# Patient Record
Sex: Male | Born: 1945 | Race: White | Hispanic: No | Marital: Married | Smoking: Former smoker
Health system: Southern US, Community
[De-identification: ages and names within clinical notes are randomized; demographics above are authoritative.]

## PROBLEM LIST (undated history)

## (undated) DIAGNOSIS — K219 Gastro-esophageal reflux disease without esophagitis: Secondary | ICD-10-CM

## (undated) DIAGNOSIS — R351 Nocturia: Secondary | ICD-10-CM

## (undated) DIAGNOSIS — T7840XA Allergy, unspecified, initial encounter: Secondary | ICD-10-CM

## (undated) DIAGNOSIS — N401 Enlarged prostate with lower urinary tract symptoms: Secondary | ICD-10-CM

## (undated) DIAGNOSIS — R319 Hematuria, unspecified: Secondary | ICD-10-CM

## (undated) HISTORY — DX: Nocturia: R35.1

## (undated) HISTORY — PX: APPENDECTOMY: SHX54

## (undated) HISTORY — PX: RECONSTRUCTION MID-FACE: SUR1085

## (undated) HISTORY — DX: Gastro-esophageal reflux disease without esophagitis: K21.9

## (undated) HISTORY — DX: Hematuria, unspecified: R31.9

## (undated) HISTORY — DX: Allergy, unspecified, initial encounter: T78.40XA

## (undated) HISTORY — DX: Benign prostatic hyperplasia with lower urinary tract symptoms: N40.1

---

## 2007-07-30 ENCOUNTER — Ambulatory Visit: Payer: Self-pay | Admitting: Internal Medicine

## 2010-09-22 ENCOUNTER — Ambulatory Visit: Payer: Self-pay

## 2010-09-27 ENCOUNTER — Ambulatory Visit: Payer: Self-pay

## 2010-09-29 ENCOUNTER — Ambulatory Visit: Payer: Self-pay

## 2012-05-03 ENCOUNTER — Ambulatory Visit: Payer: Self-pay | Admitting: General Surgery

## 2012-05-03 DIAGNOSIS — D129 Benign neoplasm of anus and anal canal: Secondary | ICD-10-CM

## 2012-05-03 DIAGNOSIS — Z1211 Encounter for screening for malignant neoplasm of colon: Secondary | ICD-10-CM

## 2012-05-03 DIAGNOSIS — D128 Benign neoplasm of rectum: Secondary | ICD-10-CM

## 2012-05-12 ENCOUNTER — Encounter: Payer: Self-pay | Admitting: General Surgery

## 2012-07-06 ENCOUNTER — Ambulatory Visit: Payer: Self-pay | Admitting: Family Medicine

## 2012-10-21 ENCOUNTER — Encounter: Payer: Self-pay | Admitting: Neurology

## 2012-12-11 DIAGNOSIS — N200 Calculus of kidney: Secondary | ICD-10-CM | POA: Insufficient documentation

## 2012-12-22 ENCOUNTER — Ambulatory Visit: Payer: Self-pay | Admitting: Urology

## 2013-01-05 ENCOUNTER — Ambulatory Visit: Payer: Self-pay | Admitting: Urology

## 2013-04-20 ENCOUNTER — Emergency Department: Payer: Self-pay | Admitting: Emergency Medicine

## 2013-04-20 LAB — CBC
HCT: 48.4 % (ref 40.0–52.0)
HGB: 16.8 g/dL (ref 13.0–18.0)
MCH: 32.6 pg (ref 26.0–34.0)
MCHC: 34.8 g/dL (ref 32.0–36.0)
MCV: 94 fL (ref 80–100)
PLATELETS: 181 10*3/uL (ref 150–440)
RBC: 5.16 10*6/uL (ref 4.40–5.90)
RDW: 13 % (ref 11.5–14.5)
WBC: 12.1 10*3/uL — AB (ref 3.8–10.6)

## 2013-04-20 LAB — URINALYSIS, COMPLETE
BILIRUBIN, UR: NEGATIVE
Glucose,UR: NEGATIVE mg/dL (ref 0–75)
Ketone: NEGATIVE
Leukocyte Esterase: NEGATIVE
NITRITE: NEGATIVE
Ph: 7 (ref 4.5–8.0)
Protein: NEGATIVE
RBC,UR: 59 /HPF (ref 0–5)
Specific Gravity: 1.009 (ref 1.003–1.030)
Squamous Epithelial: NONE SEEN

## 2013-04-20 LAB — COMPREHENSIVE METABOLIC PANEL
ALBUMIN: 4.1 g/dL (ref 3.4–5.0)
ALT: 21 U/L (ref 12–78)
AST: 13 U/L — AB (ref 15–37)
Alkaline Phosphatase: 96 U/L
Anion Gap: 3 — ABNORMAL LOW (ref 7–16)
BILIRUBIN TOTAL: 0.7 mg/dL (ref 0.2–1.0)
BUN: 21 mg/dL — AB (ref 7–18)
CALCIUM: 9.3 mg/dL (ref 8.5–10.1)
CO2: 28 mmol/L (ref 21–32)
CREATININE: 1.33 mg/dL — AB (ref 0.60–1.30)
Chloride: 108 mmol/L — ABNORMAL HIGH (ref 98–107)
EGFR (Non-African Amer.): 55 — ABNORMAL LOW
Glucose: 103 mg/dL — ABNORMAL HIGH (ref 65–99)
OSMOLALITY: 281 (ref 275–301)
Potassium: 3.7 mmol/L (ref 3.5–5.1)
SODIUM: 139 mmol/L (ref 136–145)
Total Protein: 7.8 g/dL (ref 6.4–8.2)

## 2013-05-09 ENCOUNTER — Ambulatory Visit: Payer: Medicare PPO | Admitting: General Surgery

## 2013-06-07 ENCOUNTER — Encounter: Payer: Self-pay | Admitting: *Deleted

## 2013-08-08 DIAGNOSIS — N189 Chronic kidney disease, unspecified: Secondary | ICD-10-CM | POA: Insufficient documentation

## 2013-08-21 ENCOUNTER — Ambulatory Visit: Payer: Self-pay | Admitting: Urology

## 2013-08-22 LAB — URINE CULTURE

## 2013-08-28 ENCOUNTER — Telehealth: Payer: Self-pay | Admitting: Cardiovascular Disease

## 2013-08-28 ENCOUNTER — Encounter: Payer: Self-pay | Admitting: Cardiovascular Disease

## 2013-08-28 ENCOUNTER — Ambulatory Visit (INDEPENDENT_AMBULATORY_CARE_PROVIDER_SITE_OTHER): Payer: Medicare HMO | Admitting: Cardiovascular Disease

## 2013-08-28 VITALS — BP 140/90 | HR 52 | Ht 68.0 in | Wt 165.2 lb

## 2013-08-28 DIAGNOSIS — R001 Bradycardia, unspecified: Secondary | ICD-10-CM

## 2013-08-28 DIAGNOSIS — Z01810 Encounter for preprocedural cardiovascular examination: Secondary | ICD-10-CM

## 2013-08-28 DIAGNOSIS — I498 Other specified cardiac arrhythmias: Secondary | ICD-10-CM

## 2013-08-28 DIAGNOSIS — R9431 Abnormal electrocardiogram [ECG] [EKG]: Secondary | ICD-10-CM

## 2013-08-28 NOTE — Assessment & Plan Note (Signed)
He has no symptoms of dizziness, syncope or presyncope. This seems to be asymptomatic. He can followup with me as needed if he develops any cardiac symptoms.

## 2013-08-28 NOTE — Patient Instructions (Signed)
You are at low risk for surgery from a cardiac standpoint.   Follow up as needed if you get chest pain, shortness of breath , dizziness or passing out.

## 2013-08-28 NOTE — Telephone Encounter (Signed)
Dr Luan Pulling office called to make sure pt got clearance for surgery

## 2013-08-28 NOTE — Progress Notes (Signed)
  Primary care physician: Dr. Luan Pulling.  HPI  This is a pleasant 68 year old man who was referred for preoperative cardiovascular evaluation before kidney stone surgery. The patient has no previous cardiac history. He has no history of diabetes, hypertension, hyperlipidemia or chronic kidney disease. He does have known history of recurrent nephrolithiasis with recent hematuria. He has prolonged history of tobacco use but no family history of coronary artery disease. He denies any chest pain, dyspnea or palpitations. No dizziness, syncope or presyncope. 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 Thus, he was referred for preoperative cardiovascular evaluation.  Allergies  Allergen Reactions  . Morphine And Related   . Penicillins     Other reaction(s): Unknown     No current outpatient prescriptions on file prior to visit.   No current facility-administered medications on file prior to visit.     History reviewed. No pertinent past medical history.   Past Surgical History  Procedure Laterality Date  . Appendectomy       Family History  Problem Relation Age of Onset  . Heart failure Mother   . Hypertension Mother      History   Social History  . Marital Status: Married    Spouse Name: N/A    Number of Children: N/A  . Years of Education: N/A   Occupational History  . Not on file.   Social History Main Topics  . Smoking status: Current Every Day Smoker    Types: Cigarettes  . Smokeless tobacco: Not on file  . Alcohol Use: No  . Drug Use: No  . Sexual Activity: Not on file   Other Topics Concern  . Not on file   Social History Narrative  . No narrative on file     ROS A 10 point review of system was performed. It is negative other than that mentioned in the history of present illness.   PHYSICAL EXAM   BP 140/90   Pulse 52  Ht 5\' 8"  (1.727 m)  Wt 165 lb 3 oz (74.929 kg)  BMI 25.12 kg/m2 Constitutional: He is oriented to person, place, and time. He appears well-developed and well-nourished. No distress.  HENT: No nasal discharge.  Head: Normocephalic and atraumatic.  Eyes: Pupils are equal and round.  No discharge. Neck: Normal range of motion. Neck supple. No JVD present. No thyromegaly present.  Cardiovascular: Normal rate, regular rhythm, normal heart sounds. Exam reveals no gallop and no friction rub. No murmur heard.  Pulmonary/Chest: Effort normal and breath sounds normal. No stridor. No respiratory distress. He has no wheezes. He has no rales. He exhibits no tenderness.  Abdominal: Soft. Bowel sounds are normal. He exhibits no distension. There is no tenderness. There is no rebound and no guarding.  Musculoskeletal: Normal range of motion. He exhibits no edema and no tenderness.  Neurological: He is alert and oriented to person, place, and time. Coordination normal.  Skin: Skin is warm and dry. No rash noted. He is not diaphoretic. No erythema. No pallor.  Psychiatric: He has a normal mood and affect. His behavior is normal. Judgment and thought content normal.       EKG: Sinus bradycardia with sinus arrhythmia. No significant ST or T changes. No evidence of prior infarcts.   ASSESSMENT AND PLAN

## 2013-08-28 NOTE — Assessment & Plan Note (Signed)
The patient has no previous cardiac history and no significant chronic medical conditions other than tobacco use. Overall functional capacity is very good with no reported exertional symptoms. ECG is within normal limits except for sinus bradycardia. Previous recent ECG suggested prior septal infarct. However, I suspect that was likely due to lead misplacement. Thus, I think he is at an overall low risk from a cardiac standpoint to proceed with surgery. I do not recommend any further ischemic evaluation at the present time.

## 2013-08-29 ENCOUNTER — Telehealth: Payer: Self-pay | Admitting: Cardiovascular Disease

## 2013-08-29 NOTE — Telephone Encounter (Signed)
Pt is trying to find out why conformation for clearance for surgery was not sent yet to dr Luan Pulling.

## 2013-08-29 NOTE — Telephone Encounter (Signed)
Tried to call pt to let them know that dr Luan Pulling office has already sent [place] and neurology the conformation for surgery

## 2013-08-29 NOTE — Telephone Encounter (Signed)
Informed patient that we have have the cardiac clearance letter and it will be sent today

## 2013-08-29 NOTE — Telephone Encounter (Signed)
Cardiac clearance letter and last office visit note faxed

## 2013-08-30 ENCOUNTER — Ambulatory Visit: Payer: Self-pay | Admitting: Urology

## 2013-09-01 ENCOUNTER — Emergency Department: Payer: Self-pay | Admitting: Emergency Medicine

## 2013-09-13 NOTE — Telephone Encounter (Signed)
This encounter was created in error - please disregard.

## 2014-02-22 DIAGNOSIS — M1990 Unspecified osteoarthritis, unspecified site: Secondary | ICD-10-CM | POA: Insufficient documentation

## 2014-02-22 DIAGNOSIS — K219 Gastro-esophageal reflux disease without esophagitis: Secondary | ICD-10-CM | POA: Insufficient documentation

## 2014-03-19 IMAGING — CT CT STONE STUDY
3 of 5 series · 5 of 16 positions shown, 6 images · non-contrast
Comparison: None

CLINICAL DATA: Left flank pain.

EXAM:
CT ABDOMEN AND PELVIS WITHOUT CONTRAST
TECHNIQUE: Multidetector CT imaging of the abdomen and pelvis was performed
following the standard protocol without IV contrast.

[Series 7: lung · axial · 0.76mm/px · z∈[-604,-604]mm · 1 of 17 slices shown, 2 images]
[im 1/17  soft-tissue]
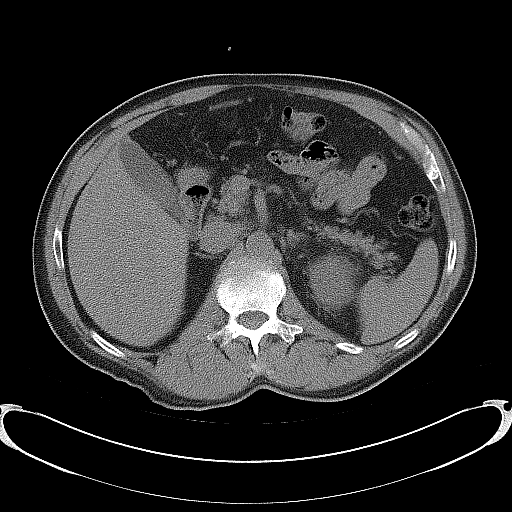
[im 1/17  bone]
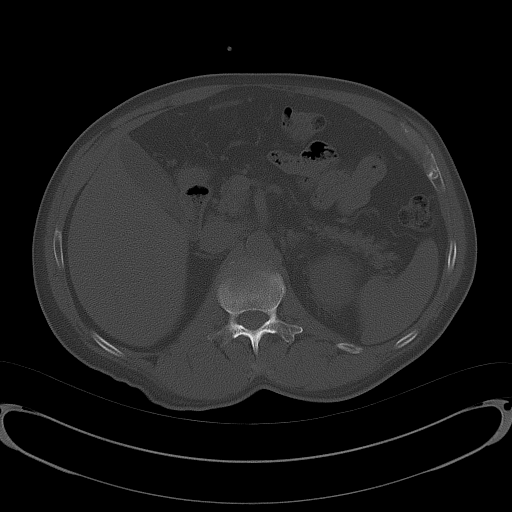

[Series 8: coronal · coronal · 0.74mm/px · 3 of 133 slices shown]
[im 34/133  soft-tissue]
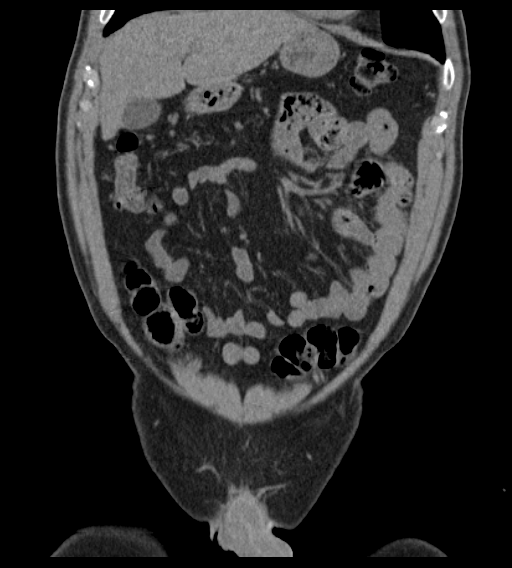
[im 67/133  soft-tissue]
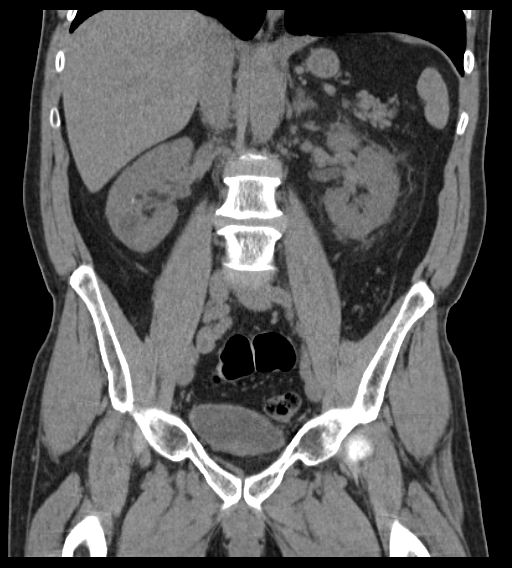
[im 100/133  soft-tissue]
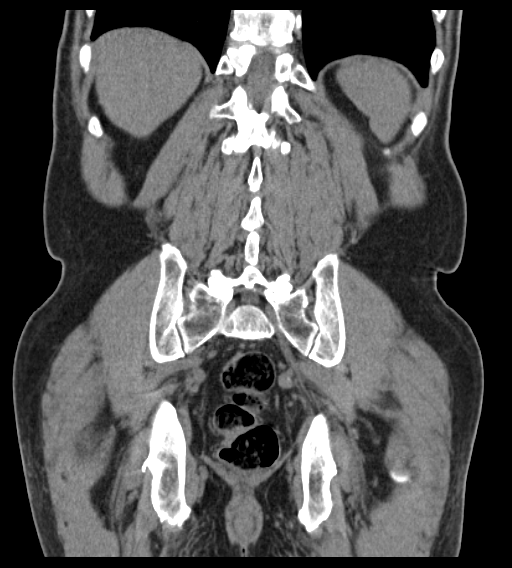

[Series 9: sagittal · sagittal · 0.55mm/px · 1 of 164 slices shown]
[im 82/164  soft-tissue]
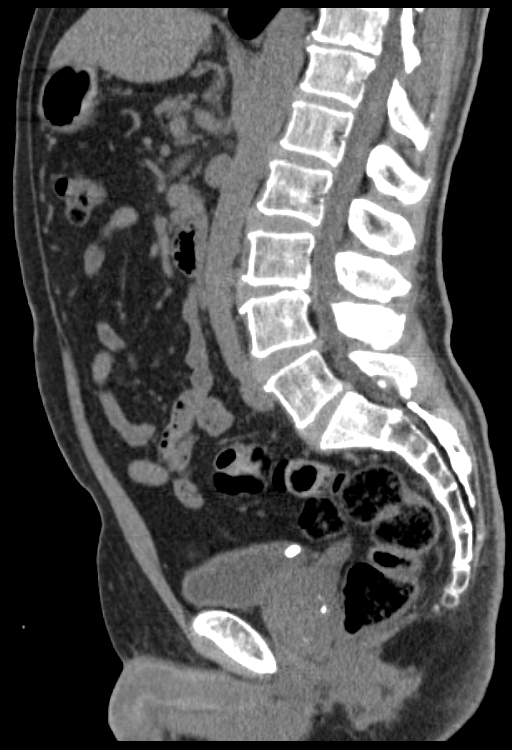

[5 of 16 positions shown; findings below may reference images not displayed]

FINDINGS: The lung bases are clear. No pleural or pericardial effusion. The
visualized portions of the liver appear normal. The gallbladder is
normal. No biliary dilatation. Normal appearance of the pancreas.
The spleen is unremarkable.

The adrenal glands both appear normal. Several tiny right renal
stones are identified. The largest is in the inferior pole measuring
5 mm. Small cysts within the inferior pole of the right kidney is
incompletely characterized without IV contrast measuring 1.3 cm,
image 33/series 2. Several small stones are also noted within the
upper pole collecting system of the left kidney. These measure up to
3 mm. There is left-sided perinephric fat stranding and
hydronephrosis. Within the proximal right ureter there is a small
stone measuring 3 mm, image 30/series 2 this is at the level of the
UPJ. Within the distal ureter there is a stone measuring 4 mm, image
61/series 2. Innumerable calculi are identified within the dependent
portion of the urinary bladder. The largest measures 8 mm, image
61/series 2. The prostate gland appears enlarged. Seminal vesicles
are symmetric and normal in appearance.

Normal caliber of the abdominal aorta. No upper abdominal or pelvic
adenopathy.

The stomach appears normal. The small bowel loops have a normal
course and caliber without obstruction. Normal appearance of the
colon.

Review of the visualized osseous structures is significant for mild
lumbar degenerative disc disease. Indeterminate sclerotic structure
within the L2 vertebra measures 1.9 cm, image number 88 of series 9.
IMPRESSION: 1. Left sided hydronephrosis secondary to 3 mm UPJ stone and 4 mm
distal ureteral stone.
2. Multiple bladder stones
3. Prostate gland enlargement
4. Indeterminate sclerotic lesion involving the L2 vertebra. Suggest
further assessment with nonemergent bone scan or lumbar spine MRI.

## 2014-06-09 NOTE — Op Note (Signed)
PATIENT NAME:  Paul Lozano, Paul Lozano MR#:  [id number] DATE OF BIRTH:  10/09/1945  DATE OF PROCEDURE:  08/30/2013  PREOPERATIVE DIAGNOSES:  1.  Bladder calculi, multiple.  2.  Left mid ureteral calculus.   POSTOPERATIVE DIAGNOSES: 1.  Bladder calculi, multiple.  2.  Left mid ureteral calculus.   PROCEDURES:  1.  Cystolitholapaxy.  2.  Left ureteroscopy.  3.  Placement of left ureteral stent.   SURGEON: John Giovanni, M.D.   ASSISTANT: None.   ANESTHESIA: General.   INDICATIONS: A 69 year old male with a history of recurrent stone disease. Paul Lozano had presented with left flank pain. KUB demonstrated multiple calculi in the bladder, approximately 17 stone measuring from 8-12 mm in diameter. Bilateral nephrolithiasis was noted. No definite ureteral calculus was seen; however, a stone protocol CT did show a 6 mm left mid ureteral calculus with mild hydronephrosis. After discussion of treatment options, Paul Lozano presents for left ureteroscopy and cystolitholapaxy.   DESCRIPTION OF PROCEDURE: The patient was taken to the cystoscopy suite and placed on the table in the supine position. A general anesthetic was administered via LMA. Paul Lozano was then placed in the low lithotomy position and his external genitalia were prepped and draped in the usual fashion. A timeout was performed per protocol. A 21-French cystoscope with 30 degree lens was lubricated and passed under direct vision. The urethra was normal in caliber without stricture. The prostatic urethra was remarkable for touching lateral lobes and a length of 3.5 cm.  There was moderate bladder neck elevation. There are multiple calculi within the bladder with the majority measuring greater than 10 mm.  Approximately 16 stones were present. No bladder mucosal lesions were identified. The ureteral orifices were normal-appearing with clear efflux. A 4.132 hydrophilic guidewire was placed through the cystoscope into the left ureteral orifice. Guidewire was easily  passed up in the renal pelvis without difficulty. The cystoscope was removed and a 7-French semi-rigid ureteroscope was passed under direct vision. The left ureteral orifice was engaged without dilation. The scope was advanced with difficulty to the mid ureter. The stone could just be seen; however, a laser fiber would not make contact with the stone. Based on the anatomy, the ureteroscope could not be advanced further. It was elected at this point to place a ureteral stent and the ureteroscope was removed. The guidewire was back loaded on the cystoscope and a 6 French/24 cm Contour ureteral stent was placed without difficulty. There was good curl seen in the renal pelvis under fluoroscopy and the distal end of the stent was well positioned in the bladder. The cystoscope was removed. A 27 French continuous flow resectoscope with visual obturator was lubricated and passed without difficulty. The adapter was placed within the sheath. A 550 micron holmium laser fiber was then placed through the resectoscope. At a power of initial 8 watts and increased 12 watts, each stone was fragmented. Once fragmented, the fragments were removed with irrigation. There were 2-3 slightly larger fragments which were broken up further with the holmium laser. There remainder of the  fragments were removed with irrigation. At the completion of the procedure, there was mild oozing from the prostate noted. No remaining bladder fragments were seen. The stent was in good position. The resectoscope was removed. A 20 French Foley catheter was placed with the aid of a catheter guide. Catheter was irrigated with return of light rosae effluent. B and O suppository was placed per rectum. The patient was taken to the PAC-U in stable condition. There  were no complications.   EBL:  Minimal.      ____________________________ Ronda Fairly. Bernardo Heater, MD scs:ts D: 08/30/2013 12:21:48 ET T: 08/30/2013 14:06:09 ET JOB#: [id number]  cc: Nicki Reaper C. Bernardo Heater,  MD, <Dictator> Abbie Sons MD ELECTRONICALLY SIGNED 08/31/2013 9:36

## 2014-07-26 ENCOUNTER — Other Ambulatory Visit: Payer: Self-pay | Admitting: Family Medicine

## 2014-11-26 ENCOUNTER — Encounter: Payer: Self-pay | Admitting: Family Medicine

## 2014-11-26 ENCOUNTER — Ambulatory Visit (INDEPENDENT_AMBULATORY_CARE_PROVIDER_SITE_OTHER): Payer: Medicare PPO | Admitting: Family Medicine

## 2014-11-26 VITALS — BP 155/68 | HR 76 | Temp 97.6°F | Resp 16 | Ht 68.0 in | Wt 160.4 lb

## 2014-11-26 DIAGNOSIS — K219 Gastro-esophageal reflux disease without esophagitis: Secondary | ICD-10-CM

## 2014-11-26 DIAGNOSIS — L03114 Cellulitis of left upper limb: Secondary | ICD-10-CM | POA: Diagnosis not present

## 2014-11-26 DIAGNOSIS — C4490 Unspecified malignant neoplasm of skin, unspecified: Secondary | ICD-10-CM | POA: Insufficient documentation

## 2014-11-26 DIAGNOSIS — N400 Enlarged prostate without lower urinary tract symptoms: Secondary | ICD-10-CM | POA: Diagnosis not present

## 2014-11-26 DIAGNOSIS — M1990 Unspecified osteoarthritis, unspecified site: Secondary | ICD-10-CM

## 2014-11-26 MED ORDER — CELECOXIB 200 MG PO CAPS: 200.0000 mg | ORAL_CAPSULE | Freq: Every day | ORAL | Status: DC

## 2014-11-26 MED ORDER — OMEPRAZOLE 20 MG PO CPDR: 20.0000 mg | DELAYED_RELEASE_CAPSULE | Freq: Every day | ORAL | Status: DC

## 2014-11-26 MED ORDER — TAMSULOSIN HCL 0.4 MG PO CAPS: 0.4000 mg | ORAL_CAPSULE | Freq: Every day | ORAL | Status: DC

## 2014-11-26 MED ORDER — DOXYCYCLINE HYCLATE 100 MG PO TABS: 100.0000 mg | ORAL_TABLET | Freq: Two times a day (BID) | ORAL | Status: DC

## 2014-11-26 NOTE — Patient Instructions (Addendum)
Use warm compresses to L. Forearm 4 times a days.  Cont. Current meds at current doses.

## 2014-11-26 NOTE — Progress Notes (Signed)
Name: Paul Lozano   MRN: [id number]    DOB: [birth year]   Date:11/26/2014       Progress Note  Subjective  Chief Complaint  Chief Complaint  Patient presents with  . Edema    left arm -hx of MRSA : Needs refills on ALL    HPI  C/o redness and tenderness of L. Forearm.  Started yesterday evening.  Fever to 100.4 last PM.  None today.  Not as red as last PM.  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   No problem-specific assessment & plan notes found for this encounter.   History reviewed. No pertinent past medical history.  Social History  Substance Use Topics  . Smoking status: Current Every Day Smoker    Types: Cigarettes  . Smokeless tobacco: Never Used  . Alcohol Use: No     Current outpatient prescriptions:  .  celecoxib (CELEBREX) 200 MG capsule, Take 1 capsule (200 mg total) by mouth daily., Disp: 90 capsule, Rfl: 1 .  Cholecalciferol (VITAMIN D-3 PO), Take by mouth., Disp: , Rfl:  .  Loratadine-Pseudoephedrine (PX ALLERGY RELIEF D, LORATID, PO), Take by mouth daily., Disp: , Rfl:  .  omeprazole (PRILOSEC) 20 MG capsule, Take 20 mg by mouth daily., Disp: , Rfl:  .  tamsulosin (FLOMAX) 0.4 MG CAPS capsule, Take 0.4 mg by mouth daily., Disp: , Rfl:   Allergies  Allergen Reactions  . Morphine And Related   . Penicillins     Other reaction(s): Unknown    Review of Systems  Constitutional: Positive for fever, chills and malaise/fatigue.  Eyes: Negative for blurred vision and double vision.  Respiratory: Negative for cough, shortness of breath and wheezing.   Cardiovascular: Negative for chest pain, palpitations and leg swelling.  Gastrointestinal: Negative for heartburn, abdominal pain and blood in stool.  Genitourinary: Negative for dysuria, urgency and frequency.  Skin:       Redness and swelling L. forearm  Neurological: Negative for weakness and headaches.      Objective  Filed Vitals:   11/26/14 1540  BP: 155/68  Pulse: 76  Temp: 97.6 F (36.4 C)   Resp: 16  Height: '5\' 8"'$  (1.727 m)  Weight: 160 lb 6.4 oz (72.757 kg)     Physical Exam  Constitutional: He is well-developed, well-nourished, and in no distress. No distress.  HENT:  Head: Normocephalic and atraumatic.  Eyes: Conjunctivae and EOM are normal. Pupils are equal, round, and reactive to light. No scleral icterus.  Neck: Normal range of motion. Neck supple. Carotid bruit is not present. No thyromegaly present.  Cardiovascular: Normal rate, regular rhythm, normal heart sounds and intact distal pulses.  Exam reveals no gallop and no friction rub.   No murmur heard. Pulmonary/Chest: Effort normal and breath sounds normal.  Course breath sounds.  Musculoskeletal: He exhibits no edema.  Lymphadenopathy:    He has no cervical adenopathy.  Skin: Skin is warm. There is erythema.  L. Forearm with redness and swelling and tenderness.  Vitals reviewed.     No results found for this or any previous visit (from the past 2160 hour(s)).   Assessment & Plan  1. Cellulitis of left upper extremity  - doxycycline (VIBRA-TABS) 100 MG tablet; Take 1 tablet (100 mg total) by mouth 2 (two) times daily. Take with food  Dispense: 20 tablet; Refill: 0  2. Gastroesophageal reflux disease without esophagitis   3. BPH (benign prostatic hyperplasia)

## 2014-11-27 ENCOUNTER — Ambulatory Visit
Admission: RE | Admit: 2014-11-27 | Discharge: 2014-11-27 | Disposition: A | Discharge status: Home / Self Care | Payer: Medicare HMO | Source: Ambulatory Visit | Attending: Family Medicine | Admitting: Family Medicine

## 2014-11-27 ENCOUNTER — Ambulatory Visit (INDEPENDENT_AMBULATORY_CARE_PROVIDER_SITE_OTHER): Payer: Medicare PPO | Admitting: Family Medicine

## 2014-11-27 ENCOUNTER — Encounter: Payer: Self-pay | Admitting: Family Medicine

## 2014-11-27 VITALS — BP 150/80 | HR 61 | Temp 98.0°F | Resp 16 | Ht 68.0 in | Wt 160.6 lb

## 2014-11-27 DIAGNOSIS — L03114 Cellulitis of left upper limb: Secondary | ICD-10-CM

## 2014-11-27 NOTE — Progress Notes (Signed)
Will discuss at appointment. [facility]

## 2014-11-27 NOTE — Progress Notes (Signed)
Name: Paul Lozano   MRN: [id number]    DOB: 1945-09-25   Date:11/27/2014       Progress Note  Subjective  Chief Complaint  Chief Complaint  Patient presents with  . Wound Infection     recheck arm    HPI Here to f/u arm cellulitis.  No problem-specific assessment & plan notes found for this encounter.   Past Medical History  Diagnosis Date  . GERD (gastroesophageal reflux disease)   . Allergy   . Benign prostatic hypertrophy (BPH) with nocturia   . Hematuria     Social History  Substance Use Topics  . Smoking status: Current Every Day Smoker -- 1.00 packs/day for 31 years    Types: Cigarettes  . Smokeless tobacco: Never Used  . Alcohol Use: No     Current outpatient prescriptions:  .  celecoxib (CELEBREX) 200 MG capsule, Take 1 capsule (200 mg total) by mouth daily., Disp: 30 capsule, Rfl: 3 .  Cholecalciferol (VITAMIN D-3 PO), Take by mouth., Disp: , Rfl:  .  doxycycline (VIBRA-TABS) 100 MG tablet, Take 1 tablet (100 mg total) by mouth 2 (two) times daily. Take with food, Disp: 20 tablet, Rfl: 0 .  fluticasone (FLONASE) 50 MCG/ACT nasal spray, Place 2 sprays into both nostrils daily., Disp: , Rfl:  .  Loratadine-Pseudoephedrine (PX ALLERGY RELIEF D, LORATID, PO), Take by mouth daily., Disp: , Rfl:  .  omeprazole (PRILOSEC) 20 MG capsule, Take 1 capsule (20 mg total) by mouth daily., Disp: 20 capsule, Rfl: 6 .  tamsulosin (FLOMAX) 0.4 MG CAPS capsule, Take 1 capsule (0.4 mg total) by mouth daily., Disp: 30 capsule, Rfl: 6  Allergies  Allergen Reactions  . Morphine And Related   . Penicillins     Other reaction(s): Unknown    Review of Systems  Constitutional: Negative for fever, chills and malaise/fatigue.  Neurological: Negative for weakness.      Objective  Filed Vitals:   11/27/14 1307  BP: 164/82  Pulse: 61  Temp: 98 F (36.7 C)  TempSrc: Oral  Resp: 16  Height: '5\' 8"'$  (1.727 m)  Weight: 160 lb 9.6 oz (72.848 kg)     Physical Exam   Constitutional: He is well-developed, well-nourished, and in no distress. No distress.  Skin:  Redness and heart have expanded in L. Forearm.  The area involved is around where he had surgical repair of severe fracture in 2002 with fixation.  Vitals reviewed.     No results found for this or any previous visit (from the past 2160 hour(s)).   Assessment & Plan  1. Cellulitis of left upper extremity -cont. Doxycycline - DG Forearm Left; Future

## 2014-11-28 ENCOUNTER — Encounter: Payer: Self-pay | Admitting: Family Medicine

## 2014-11-28 ENCOUNTER — Ambulatory Visit (INDEPENDENT_AMBULATORY_CARE_PROVIDER_SITE_OTHER): Payer: Medicare PPO | Admitting: Family Medicine

## 2014-11-28 ENCOUNTER — Ambulatory Visit (INDEPENDENT_AMBULATORY_CARE_PROVIDER_SITE_OTHER): Payer: Medicare PPO | Admitting: General Surgery

## 2014-11-28 ENCOUNTER — Encounter: Payer: Self-pay | Admitting: General Surgery

## 2014-11-28 VITALS — BP 150/85 | HR 75 | Temp 97.8°F | Resp 16 | Ht 68.0 in | Wt 160.2 lb

## 2014-11-28 VITALS — BP 130/76 | HR 72 | Resp 12 | Ht 68.0 in | Wt 161.0 lb

## 2014-11-28 DIAGNOSIS — L03114 Cellulitis of left upper limb: Secondary | ICD-10-CM | POA: Diagnosis not present

## 2014-11-28 DIAGNOSIS — L02414 Cutaneous abscess of left upper limb: Secondary | ICD-10-CM

## 2014-11-28 NOTE — Progress Notes (Signed)
Patient ID: Paul Lozano, male   DOB: [birth year], 69 y.o.   MRN: [id number]  Chief Complaint  Patient presents with  . Other    left arm cellulitis    HPI Paul Lozano is a 69 y.o. male.  Here today for evaluation of left arm cellulitis. He is currently on doxycycline x 2 days. He states the area is red and swollen since Sunday. He denies any trauma. 4 years ago he had a similar problem after he hurt his hand with a lawnmower (laceration) and developed a large 5-6 cm abscess that was drained. C&S then grew MRSA and he was treated with Septra.    HPI  Past Medical History  Diagnosis Date  . GERD (gastroesophageal reflux disease)   . Allergy   . Benign prostatic hypertrophy (BPH) with nocturia   . Hematuria     Past Surgical History  Procedure Laterality Date  . Appendectomy    . Reconstruction mid-face      Family History  Problem Relation Age of Onset  . Heart failure Mother   . Hypertension Mother   . Cancer Father     lung  . Cancer Brother     lung    Social History Social History  Substance Use Topics  . Smoking status: Current Every Day Smoker -- 1.00 packs/day for 31 years    Types: Cigarettes  . Smokeless tobacco: Never Used  . Alcohol Use: No    Allergies  Allergen Reactions  . Morphine And Related   . Penicillins     Other reaction(s): Unknown    Current Outpatient Prescriptions  Medication Sig Dispense Refill  . celecoxib (CELEBREX) 200 MG capsule TK 1 C PO QD  3  . Cholecalciferol (VITAMIN D-3 PO) Take by mouth.    . doxycycline (VIBRA-TABS) 100 MG tablet Take 1 tablet (100 mg total) by mouth 2 (two) times daily. Take with food 20 tablet 0  . fluticasone (FLONASE) 50 MCG/ACT nasal spray Place 2 sprays into both nostrils daily.    . Loratadine-Pseudoephedrine (PX ALLERGY RELIEF D, LORATID, PO) Take by mouth daily.    Marland Kitchen omeprazole (PRILOSEC) 20 MG capsule Take 1 capsule (20 mg total) by mouth daily. 20 capsule 6  . tamsulosin (FLOMAX)  0.4 MG CAPS capsule Take 1 capsule (0.4 mg total) by mouth daily. 30 capsule 6   No current facility-administered medications for this visit.    Review of Systems Review of Systems  Constitutional: Positive for fever and chills.  Respiratory: Negative.   Cardiovascular: Negative.     Blood pressure 130/76, pulse 72, resp. rate 12, height '5\' 8"'$  (1.727 m), weight 161 lb (73.029 kg).  Physical Exam Physical Exam  Constitutional: Vital signs are normal. He appears well-developed and well-nourished.  Eyes: Conjunctivae are normal. No scleral icterus.  Cardiovascular:  Pulses:      Radial pulses are 2+ on the left side.  Lymphadenopathy:    He has no axillary adenopathy.       Left: No supraclavicular and no epitrochlear adenopathy present.  Neurological: He is alert.  Skin:       Data Reviewed Previous notes  Assessment    Recurring infection, left forearm; not an abscess at this time.    Plan    Continue doxycycline. Return in 2 days. Assess need for drainage at that time.     PCP:  Philbert Riser, Raynelle Chary G 11/28/2014, 2:36 PM

## 2014-11-28 NOTE — Patient Instructions (Signed)
May take his Norco at home for pain.

## 2014-11-28 NOTE — Progress Notes (Signed)
Name: Paul Lozano   MRN: [id number]    DOB: 1945/07/22   Date:11/28/2014       Progress Note  Subjective  Chief Complaint  Chief Complaint  Patient presents with  . Wound Infection    Left arm OBTW pt was nausea and threw up this morning    HPI  Here to recheck L' \\arm'$  cellulitis.  X-ray showed no bone involvement. No problem-specific assessment & plan notes found for this encounter.   Past Medical History  Diagnosis Date  . GERD (gastroesophageal reflux disease)   . Allergy   . Benign prostatic hypertrophy (BPH) with nocturia   . Hematuria     Social History  Substance Use Topics  . Smoking status: Current Every Day Smoker -- 1.00 packs/day for 31 years    Types: Cigarettes  . Smokeless tobacco: Never Used  . Alcohol Use: No     Current outpatient prescriptions:  .  celecoxib (CELEBREX) 200 MG capsule, TK 1 C PO QD, Disp: , Rfl: 3 .  Cholecalciferol (VITAMIN D-3 PO), Take by mouth., Disp: , Rfl:  .  doxycycline (VIBRA-TABS) 100 MG tablet, Take 1 tablet (100 mg total) by mouth 2 (two) times daily. Take with food, Disp: 20 tablet, Rfl: 0 .  fluticasone (FLONASE) 50 MCG/ACT nasal spray, Place 2 sprays into both nostrils daily., Disp: , Rfl:  .  Loratadine-Pseudoephedrine (PX ALLERGY RELIEF D, LORATID, PO), Take by mouth daily., Disp: , Rfl:  .  omeprazole (PRILOSEC) 20 MG capsule, Take 1 capsule (20 mg total) by mouth daily., Disp: 20 capsule, Rfl: 6 .  tamsulosin (FLOMAX) 0.4 MG CAPS capsule, Take 1 capsule (0.4 mg total) by mouth daily., Disp: 30 capsule, Rfl: 6  Allergies  Allergen Reactions  . Morphine And Related   . Penicillins     Other reaction(s): Unknown    Review of Systems  Constitutional: Positive for fever and chills.  Respiratory: Negative for cough, sputum production, shortness of breath and wheezing.   Cardiovascular: Negative for chest pain, palpitations and leg swelling.  Gastrointestinal: Positive for nausea and vomiting.  Genitourinary:  Negative for dysuria, urgency and frequency.  Skin:       Cellulitis of L. Forearm.      Objective  Filed Vitals:   11/28/14 0831  BP: 150/85  Pulse: 75  Temp: 97.8 F (36.6 C)  TempSrc: Oral  Resp: 16  Height: '5\' 8"'$  (1.727 m)  Weight: 160 lb 3.2 oz (72.666 kg)     Physical Exam  Constitutional: He is well-developed, well-nourished, and in no distress. No distress.  HENT:  Head: Normocephalic and atraumatic.  Lymphadenopathy:       Right axillary: No pectoral and no lateral adenopathy present.       Left axillary: No pectoral and no lateral adenopathy present. No axilllary nodes.  Skin:  Cellulitis of L forearm still red and swollen and warm.  Raised but not expanding up forearm.  Vitals reviewed.     No results found for this or any previous visit (from the past 2160 hour(s)).   Assessment & Plan  1. Cellulitis of left upper extremity -Cont. Doxy - Ambulatory referral to General Surgery -may take Hydrocodone that he hs at home as needed for pain.

## 2014-11-28 NOTE — Patient Instructions (Addendum)
Patient to return in Friday

## 2014-11-30 ENCOUNTER — Encounter: Payer: Self-pay | Admitting: General Surgery

## 2014-11-30 ENCOUNTER — Ambulatory Visit (INDEPENDENT_AMBULATORY_CARE_PROVIDER_SITE_OTHER): Payer: Medicare PPO | Admitting: General Surgery

## 2014-11-30 VITALS — BP 129/72 | HR 78 | Resp 13 | Ht 68.0 in | Wt 161.0 lb

## 2014-11-30 DIAGNOSIS — L02414 Cutaneous abscess of left upper limb: Secondary | ICD-10-CM | POA: Diagnosis not present

## 2014-11-30 NOTE — Patient Instructions (Addendum)
Continue on antibiotics. Warm moist compress. Call with any worsening symptoms. Follow up next week.

## 2014-11-30 NOTE — Progress Notes (Signed)
Patient ID: Paul Lozano, male   DOB: 1945/12/03, 69 y.o.   MRN: [id number] Patient is following up here for left forearm cellulitis. He reports that the area has not improved. He has been on his antibiotics since Monday 11/28/14. He reports the pain as a level 4.  Exam shows same redness and induration in left forearm but no fluctuance noted. With consent a aspiration of the central area was performed- no fluid or pus seen. Advised to continue antibiotic, call for any worsening, fever/chils.      PCP: Philbert Riser, Nikki Dom

## 2014-12-03 ENCOUNTER — Other Ambulatory Visit: Payer: Self-pay | Admitting: Family Medicine

## 2014-12-03 ENCOUNTER — Encounter: Payer: Self-pay | Admitting: General Surgery

## 2014-12-03 ENCOUNTER — Ambulatory Visit (INDEPENDENT_AMBULATORY_CARE_PROVIDER_SITE_OTHER): Payer: Medicare PPO | Admitting: General Surgery

## 2014-12-03 VITALS — BP 122/74 | HR 79 | Resp 12 | Ht 68.0 in | Wt 158.0 lb

## 2014-12-03 DIAGNOSIS — N400 Enlarged prostate without lower urinary tract symptoms: Secondary | ICD-10-CM

## 2014-12-03 DIAGNOSIS — K219 Gastro-esophageal reflux disease without esophagitis: Secondary | ICD-10-CM

## 2014-12-03 DIAGNOSIS — L03114 Cellulitis of left upper limb: Secondary | ICD-10-CM

## 2014-12-03 DIAGNOSIS — L02414 Cutaneous abscess of left upper limb: Secondary | ICD-10-CM

## 2014-12-03 MED ORDER — OMEPRAZOLE 20 MG PO CPDR: 20.0000 mg | DELAYED_RELEASE_CAPSULE | Freq: Every day | ORAL | Status: DC

## 2014-12-03 MED ORDER — TAMSULOSIN HCL 0.4 MG PO CAPS: 0.4000 mg | ORAL_CAPSULE | Freq: Every day | ORAL | Status: DC

## 2014-12-03 MED ORDER — DOXYCYCLINE HYCLATE 100 MG PO TABS: 100.0000 mg | ORAL_TABLET | Freq: Two times a day (BID) | ORAL | Status: DC

## 2014-12-03 MED ORDER — CELECOXIB 200 MG PO CAPS: 200.0000 mg | ORAL_CAPSULE | Freq: Every day | ORAL | Status: DC

## 2014-12-03 NOTE — Telephone Encounter (Signed)
Pt  Wife called wanted to know if we could call in a  90 days of Meds for a year (Tamsulosin, celecoxib,omeprazole) if this can not be called in   Please call the pt. Wife  [phone].

## 2014-12-03 NOTE — Patient Instructions (Signed)
Follow up as needed or call if anything changes.

## 2014-12-03 NOTE — Progress Notes (Signed)
Patient ID: Paul Lozano, male   DOB: April 18, 1945, 69 y.o.   MRN: [id number] Patient is following up here for left forearm cellulitis, he is still taking antibiotics he has two days left. He states the area is improving not as painful.  Redness still present but improving, no pocket of fluid, no fluctuance.  No swollen lymph nodes. Continue taking antibiotic for 5 days.  Discussed seeing an orthopedic doctor to assess arm. Follow up as needed or if anything changes.    PCP: Larene Beach, Brooke Bonito

## 2015-01-22 ENCOUNTER — Encounter: Payer: Self-pay | Admitting: Family Medicine

## 2015-04-14 ENCOUNTER — Other Ambulatory Visit: Payer: Self-pay | Admitting: Family Medicine

## 2015-04-28 ENCOUNTER — Other Ambulatory Visit: Payer: Self-pay | Admitting: Family Medicine

## 2015-09-02 ENCOUNTER — Other Ambulatory Visit: Payer: Self-pay | Admitting: Family Medicine

## 2015-09-02 ENCOUNTER — Telehealth: Payer: Self-pay | Admitting: Family Medicine

## 2015-09-02 NOTE — Telephone Encounter (Signed)
Pt needs a 90 day supply of omeprazole sent to pharmacy.  The call back number is [phone]

## 2015-09-02 NOTE — Telephone Encounter (Signed)
Refill request already sent by pharmacy and is in Dr. Luan Pulling refill box pending approval.[place]

## 2015-12-26 ENCOUNTER — Other Ambulatory Visit: Payer: Self-pay | Admitting: Family Medicine

## 2015-12-26 DIAGNOSIS — N400 Enlarged prostate without lower urinary tract symptoms: Secondary | ICD-10-CM

## 2016-02-03 ENCOUNTER — Encounter: Payer: Self-pay | Admitting: Family Medicine

## 2016-02-03 ENCOUNTER — Ambulatory Visit (INDEPENDENT_AMBULATORY_CARE_PROVIDER_SITE_OTHER): Payer: Medicare Other | Admitting: Family Medicine

## 2016-02-03 VITALS — BP 130/75 | HR 84 | Temp 97.7°F | Resp 16 | Ht 68.0 in | Wt 165.0 lb

## 2016-02-03 DIAGNOSIS — N200 Calculus of kidney: Secondary | ICD-10-CM

## 2016-02-03 DIAGNOSIS — N401 Enlarged prostate with lower urinary tract symptoms: Secondary | ICD-10-CM

## 2016-02-03 DIAGNOSIS — M1990 Unspecified osteoarthritis, unspecified site: Secondary | ICD-10-CM | POA: Diagnosis not present

## 2016-02-03 DIAGNOSIS — K219 Gastro-esophageal reflux disease without esophagitis: Secondary | ICD-10-CM | POA: Diagnosis not present

## 2016-02-03 NOTE — Progress Notes (Signed)
Name: Paul Lozano   MRN: [id number]    DOB: Mar 16, 1945   Date:02/03/2016       Progress Note  Subjective  Chief Complaint  Chief Complaint  Patient presents with  . Arthritis  . Gastroesophageal Reflux  . Benign Prostatic Hypertrophy    HPI Here for f/u of arthritis, BPH and GERD.  BP up a little today.  He is taking his meds.  He is feeling ok.  No problem-specific Assessment & Plan notes found for this encounter.   Past Medical History:  Diagnosis Date  . Allergy   . Benign prostatic hypertrophy (BPH) with nocturia   . GERD (gastroesophageal reflux disease)   . Hematuria     Past Surgical History:  Procedure Laterality Date  . APPENDECTOMY    . RECONSTRUCTION MID-FACE      Family History  Problem Relation Age of Onset  . Heart failure Mother   . Hypertension Mother   . Cancer Father     lung  . Cancer Brother     lung    Social History   Social History  . Marital status: Married    Spouse name: N/A  . Number of children: N/A  . Years of education: N/A   Occupational History  . Not on file.   Social History Main Topics  . Smoking status: Current Every Day Smoker    Packs/day: 1.00    Years: 31.00    Types: Cigarettes  . Smokeless tobacco: Never Used  . Alcohol use No  . Drug use: No  . Sexual activity: Not on file   Other Topics Concern  . Not on file   Social History Narrative  . No narrative on file     Current Outpatient Prescriptions:  .  celecoxib (CELEBREX) 200 MG capsule, TAKE 1 CAPSULE(200 MG) BY MOUTH DAILY, Disp: 90 capsule, Rfl: 3 .  Cholecalciferol (VITAMIN D-3 PO), Take by mouth., Disp: , Rfl:  .  fluticasone (FLONASE) 50 MCG/ACT nasal spray, SPRAY TWICE IN EACH NOSTRIL ONCE DAILY, Disp: 16 g, Rfl: 0 .  Loratadine-Pseudoephedrine (PX ALLERGY RELIEF D, LORATID, PO), Take by mouth daily., Disp: , Rfl:  .  omeprazole (PRILOSEC) 20 MG capsule, TAKE 1 CAPSULE(20 MG) BY MOUTH DAILY, Disp: 90 capsule, Rfl: 3 .  tamsulosin  (FLOMAX) 0.4 MG CAPS capsule, TAKE 1 CAPSULE(0.4 MG) BY MOUTH DAILY, Disp: 90 capsule, Rfl: 3  Allergies  Allergen Reactions  . Morphine And Related   . Penicillins     Other reaction(s): Unknown     Review of Systems  Constitutional: Negative for chills, fever, malaise/fatigue and weight loss.  HENT: Negative for hearing loss and tinnitus.   Eyes: Negative for blurred vision and double vision.  Respiratory: Negative for cough, shortness of breath and wheezing.   Cardiovascular: Negative for chest pain, palpitations and leg swelling.  Gastrointestinal: Positive for heartburn. Negative for abdominal pain and blood in stool.  Genitourinary: Negative for dysuria, frequency and urgency.  Musculoskeletal: Positive for joint pain.  Skin: Negative for rash.  Neurological: Negative for dizziness, tingling, tremors, weakness and headaches.      Objective  Vitals:   02/03/16 1501 02/03/16 1548  BP: (!) 144/82 130/75  Pulse: 84   Resp: 16   Temp: 97.7 F (36.5 C)   TempSrc: Oral   Weight: 165 lb (74.8 kg)   Height: '5\' 8"'$  (1.727 m)     Physical Exam  Constitutional: He is oriented to person, place, and time and well-developed,  well-nourished, and in no distress. No distress.  HENT:  Head: Normocephalic and atraumatic.  Eyes: Conjunctivae and EOM are normal. Pupils are equal, round, and reactive to light. No scleral icterus.  Neck: Normal range of motion. Neck supple. Carotid bruit is not present. No thyromegaly present.  Cardiovascular: Normal rate, regular rhythm and normal heart sounds.  Exam reveals no gallop and no friction rub.   No murmur heard. Musculoskeletal: He exhibits no edema.  Lymphadenopathy:    He has no cervical adenopathy.  Neurological: He is alert and oriented to person, place, and time.  Vitals reviewed.      No results found for this or any previous visit (from the past 2160 hour(s)).   Assessment & Plan  Problem List Items Addressed This Visit       Digestive   Gastroesophageal reflux disease - Primary   Relevant Orders   CBC with Differential     Musculoskeletal and Integument   Arthritis   Relevant Orders   COMPLETE METABOLIC PANEL WITH GFR     Genitourinary   Calculus of kidney   BPH (benign prostatic hyperplasia)   Relevant Orders   Lipid Profile      Meds ordered this encounter  Medications  . DISCONTD: potassium citrate (UROCIT-K) 10 MEQ (1080 MG) SR tablet    Sig: Take 10 mEq by mouth 2 (two) times daily.   1. Gastroesophageal reflux disease without esophagitis  - CBC with Differential  2. Arthritis  - COMPLETE METABOLIC PANEL WITH GFR  3. Benign prostatic hyperplasia with lower urinary tract symptoms, symptom details unspecified  - Lipid Profile  4. Calculus of kidney   Cont meds.

## 2016-02-24 ENCOUNTER — Other Ambulatory Visit: Payer: Self-pay | Admitting: Family Medicine

## 2016-02-24 ENCOUNTER — Other Ambulatory Visit: Payer: Medicare PPO

## 2016-02-24 LAB — CBC WITH DIFFERENTIAL/PLATELET
Basophils Absolute: 0 cells/uL (ref 0–200)
Basophils Relative: 0 %
EOS PCT: 2 %
Eosinophils Absolute: 124 cells/uL (ref 15–500)
HEMATOCRIT: 47.6 % (ref 38.5–50.0)
HEMOGLOBIN: 16.1 g/dL (ref 13.2–17.1)
LYMPHS ABS: 1674 {cells}/uL (ref 850–3900)
Lymphocytes Relative: 27 %
MCH: 31.1 pg (ref 27.0–33.0)
MCHC: 33.8 g/dL (ref 32.0–36.0)
MCV: 92.1 fL (ref 80.0–100.0)
MONO ABS: 558 {cells}/uL (ref 200–950)
MPV: 9.6 fL (ref 7.5–12.5)
Monocytes Relative: 9 %
Neutro Abs: 3844 cells/uL (ref 1500–7800)
Neutrophils Relative %: 62 %
Platelets: 182 10*3/uL (ref 140–400)
RBC: 5.17 MIL/uL (ref 4.20–5.80)
RDW: 13.4 % (ref 11.0–15.0)
WBC: 6.2 10*3/uL (ref 3.8–10.8)

## 2016-02-24 LAB — LIPID PANEL
CHOL/HDL RATIO: 3.5 ratio (ref <5.0)
CHOLESTEROL: 132 mg/dL (ref <200)
HDL: 38 mg/dL — AB (ref >40)
LDL CALC: 59 mg/dL (ref <100)
Triglycerides: 175 mg/dL — ABNORMAL HIGH (ref <150)
VLDL: 35 mg/dL — AB (ref <30)

## 2016-02-24 LAB — COMPLETE METABOLIC PANEL WITH GFR
ALBUMIN: 4 g/dL (ref 3.6–5.1)
ALK PHOS: 78 U/L (ref 40–115)
ALT: 9 U/L (ref 9–46)
AST: 11 U/L (ref 10–35)
BUN: 21 mg/dL (ref 7–25)
CALCIUM: 9.7 mg/dL (ref 8.6–10.3)
CHLORIDE: 110 mmol/L (ref 98–110)
CO2: 28 mmol/L (ref 20–31)
Creat: 1.13 mg/dL (ref 0.70–1.18)
GFR, Est African American: 75 mL/min (ref >=60)
GFR, Est Non African American: 65 mL/min (ref >=60)
GLUCOSE: 92 mg/dL (ref 65–99)
POTASSIUM: 4 mmol/L (ref 3.5–5.3)
SODIUM: 142 mmol/L (ref 135–146)
Total Bilirubin: 0.8 mg/dL (ref 0.2–1.2)
Total Protein: 6.7 g/dL (ref 6.1–8.1)

## 2016-03-09 ENCOUNTER — Ambulatory Visit (INDEPENDENT_AMBULATORY_CARE_PROVIDER_SITE_OTHER): Payer: Medicare Other | Admitting: Family Medicine

## 2016-03-09 ENCOUNTER — Encounter: Payer: Self-pay | Admitting: Family Medicine

## 2016-03-09 VITALS — BP 141/75 | HR 67 | Temp 97.8°F | Resp 16 | Ht 68.0 in | Wt 162.0 lb

## 2016-03-09 DIAGNOSIS — L682 Localized hypertrichosis: Secondary | ICD-10-CM

## 2016-03-09 DIAGNOSIS — H6122 Impacted cerumen, left ear: Secondary | ICD-10-CM | POA: Insufficient documentation

## 2016-03-09 DIAGNOSIS — Z789 Other specified health status: Secondary | ICD-10-CM

## 2016-03-09 NOTE — Addendum Note (Signed)
Addended by: Olin Hauser on: 03/09/2016 05:34 PM   Modules accepted: Level of Service

## 2016-03-09 NOTE — Progress Notes (Signed)
Subjective:    Patient ID: Paul Lozano, male    DOB: 1945-06-14, 71 y.o.   MRN: [id number]  Paul Lozano is a 71 y.o. male presenting on 03/09/2016 for Cerumen Impaction (more Left side)  Patient presents for a same day appointment.  HPI   LEFT EAR IMPACTED CERUMEN / Reduced Hearing: Reports some chronic history of ear wax in both ears, more recently over past several days noticed had difficulty hearing Left ear only, has history of wax build up in Right ear that was cleaned out >1 year ago and has done well since. Now concerned about Left ear. He does use Q-tips regularly and concern may have pushed back ear wax. Has not seen ENT - Tried home remedy olive oil drops and warm water to flush some at home without good relief - Denies recent illness, fever/chills, sweats, cough, congestion, sinus pressure, ear pain, ear drainage  Social History  Substance Use Topics  . Smoking status: Current Every Day Smoker    Packs/day: 1.00    Years: 31.00    Types: Cigarettes  . Smokeless tobacco: Never Used  . Alcohol use No    Review of Systems Per HPI unless specifically indicated above     Objective:    BP (!) 141/75   Pulse 67   Temp 97.8 F (36.6 C) (Oral)   Resp 16   Ht 5' 8"  (1.727 m)   Wt 162 lb (73.5 kg)   BMI 24.63 kg/m   Wt Readings from Last 3 Encounters:  03/09/16 162 lb (73.5 kg)  02/03/16 165 lb (74.8 kg)  12/03/14 158 lb (71.7 kg)    Physical Exam  Constitutional: He appears well-developed and well-nourished. No distress.  Well-appearing, comfortable, cooperative  HENT:  Head: Normocephalic and atraumatic.  Frontal / maxillary sinuses non-tender. Nares patent without purulence or edema.  Right ear canal clear with normal appearing TM without effusion, erythema or bulging  Left ear is obscured by thick hard dry impacted cerumen.  Both ears with significant amount of hair extending out of pinna from ear canal.  Oropharynx clear without erythema,  exudates, edema or asymmetry.  Eyes:  Right eye chronic pupillary defect  Neurological: He is alert.  Skin: Skin is warm and dry. He is not diaphoretic.  Nursing note and vitals reviewed.   ________________________________________________________ PROCEDURE NOTE Date: 03/09/16 Left Ear Lavage / Cerumen Removal Discussed benefits and risks (including pain / discomforts, dizziness, minor abrasion of ear canal). Verbal consent given by patient. Medication:  carbamide peroxide ear drops, Ear Lavage Solution (warm water + hydrogen peroxide) Performed by Dr Parks Ranger Several drops of carbamide peroxide placed in [place], allowed to sit for few minutes. Ear lavage solution flushed into Left ear in attempt to dislodge and remove ear wax. Results significant improvement with some large removed thick cerumen, however large amounts of residual stringy lighter and softer cerumen still in place, difficulty in removing remaining wax. Some manual extraction with curette was used.  Repeat Ear Exam: - Significant softening of ear wax with removal of impaction now TM is visible on Left, however still some remaining wax and extensive hair obscuring view     Assessment & Plan:   Problem List Items Addressed This Visit    History of excessive cerumen   Hearing loss of left ear due to cerumen impaction - Primary    Significant amount of large thick impacted cerumen Left ear only, suspected primary cause of current reduced bilateral hearing, however has  had recurrence in past of bilateral cerumen, with excessive ear wax production and hairy ear canals contributing - Consider presbycusis in 28yrpatient, but seems less likely given history of more recent change and not gradual loss. - No prior hearing aids or hearing evaluation.  Plan: 1. Successful office Left  ear lavage cerumen impaction removal today, re-evaluated with improved but still some residual soft cerumen - *Note hearing improved at end of  visit with improvement* 2. Start home OTC Debrox regularly for next week, can continue ear flushing at home, future may use olive oil to prevent problem once no impaction 3. Counseled on avoiding Q-tips and may use Kleenex as wick 4. Follow-up as needed, discussed may need ENT in future if refractory. Consider may do well in office with bionix ear removal artic[facility]      Other Visit Diagnoses    Hairy ears     - contributing to excessive cerumen, limited ability to completely clear remaining wax due to hair      No orders of the defined types were placed in this encounter.     Follow up plan: Return in about 4 weeks (around 04/06/2016) for Left ear impaction ear wax.  ANobie Putnam DM[facility] 03/09/2016, 5:33 PM

## 2016-03-09 NOTE — Patient Instructions (Signed)
Thank you for coming in to clinic today.  1. You have thick impacted ear wax (cerumen) blocking ear canals and ear drums. This is the most likely cause of reduced hearing and ear pain and discomfort. - We were able to remove majority of the ear wax with flushing in the office today, however still some deeper thicker wax in Left ear  Right ear has no wax  Recommend using the same ear drops at home over the counter Debrox (Carbamide peroxide), use on LEFT only for now following instructions on bottle, pharmacist will direct you to the appropriate ear drops if you need help. May take a week or more.  Avoid using Q-tips inside ears, as this can push wax deeper, but you can try to use rolled up kleenex as a wick to absorb fluid and wax as well.  For now stop olive oil, but in future once we unblock ear then you can resume olive oil as needed  If you are not making progress with ear wax removal at home, or the problem keeps coming back, please notify our office or return for re-evaluation, and we can discuss referral to ENT office for more formal ear wax removal.  [facility] ENT [facility] [address]  [place], [place] [address] Ph: [phone]  [facility] ENT [facility] Office [address]  [place], [place] [address] Ph: [phone]  In the future we if we can obtain that other ear wax removal tool that we can use  Please schedule a follow-up appointment with Dr. Parks Ranger in 2-3 weeks as needed for Left ear impacted ear wax  If you have any other questions or concerns, please feel free to call the clinic or send a message through [place]. You may also schedule an earlier appointment if necessary.  Nobie Putnam, DO [place]

## 2016-03-09 NOTE — Assessment & Plan Note (Signed)
Significant amount of large thick impacted cerumen Left ear only, suspected primary cause of current reduced bilateral hearing, however has had recurrence in past of bilateral cerumen, with excessive ear wax production and hairy ear canals contributing - Consider presbycusis in 30yrpatient, but seems less likely given history of more recent change and not gradual loss. - No prior hearing aids or hearing evaluation.  Plan: 1. Successful office Left  ear lavage cerumen impaction removal today, re-evaluated with improved but still some residual soft cerumen - *Note hearing improved at end of visit with improvement* 2. Start home OTC Debrox regularly for next week, can continue ear flushing at home, future may use olive oil to prevent problem once no impaction 3. Counseled on avoiding Q-tips and may use Kleenex as wick 4. Follow-up as needed, discussed may need ENT in future if refractory. Consider may do well in office with [facility]ionix [facility]

## 2016-11-17 ENCOUNTER — Ambulatory Visit (INDEPENDENT_AMBULATORY_CARE_PROVIDER_SITE_OTHER): Payer: Medicare Other | Admitting: Family Medicine

## 2016-11-17 ENCOUNTER — Encounter: Payer: Self-pay | Admitting: Family Medicine

## 2016-11-17 VITALS — BP 132/79 | HR 61 | Temp 97.7°F | Resp 16 | Ht 68.0 in | Wt 160.4 lb

## 2016-11-17 DIAGNOSIS — H6122 Impacted cerumen, left ear: Secondary | ICD-10-CM | POA: Diagnosis not present

## 2016-11-17 DIAGNOSIS — K219 Gastro-esophageal reflux disease without esophagitis: Secondary | ICD-10-CM | POA: Diagnosis not present

## 2016-11-17 DIAGNOSIS — N401 Enlarged prostate with lower urinary tract symptoms: Secondary | ICD-10-CM | POA: Diagnosis not present

## 2016-11-17 DIAGNOSIS — N182 Chronic kidney disease, stage 2 (mild): Secondary | ICD-10-CM

## 2016-11-17 DIAGNOSIS — Z85828 Personal history of other malignant neoplasm of skin: Secondary | ICD-10-CM | POA: Diagnosis not present

## 2016-11-17 DIAGNOSIS — M1990 Unspecified osteoarthritis, unspecified site: Secondary | ICD-10-CM

## 2016-11-17 DIAGNOSIS — R3912 Poor urinary stream: Secondary | ICD-10-CM

## 2016-11-17 MED ORDER — TAMSULOSIN HCL 0.4 MG PO CAPS: 0.4000 mg | ORAL_CAPSULE | Freq: Every day | ORAL | 3 refills | Status: DC

## 2016-11-17 MED ORDER — OMEPRAZOLE 20 MG PO CPDR: 20.0000 mg | DELAYED_RELEASE_CAPSULE | Freq: Every day | ORAL | 3 refills | Status: DC

## 2016-11-17 MED ORDER — CELECOXIB 200 MG PO CAPS: 200.0000 mg | ORAL_CAPSULE | Freq: Every day | ORAL | 3 refills | Status: DC

## 2016-11-17 NOTE — Patient Instructions (Addendum)
Thank you for coming to the clinic today.  1.  Refilled Celebrex 200mg  once daily  Recommend to start taking Tylenol Extra Strength 500mg  tabs - take 1 to 2 tabs per dose (max 1000mg ) every 6-8 hours for pain (take regularly, don't skip a dose for next 7 days), max 24 hour daily dose is 6 tablets or 3000mg . In the future you can repeat the same everyday Tylenol course for 1-2 weeks at a time.   2. Refills for Flomax, Omeprazole also completed 90 day with 3 refills  WIll refill again in 6 months to keep at yearly  DUE for [place] (no food or drink after midnight before the lab appointment, only water or coffee without cream/sugar on the morning of)  SCHEDULE "Lab Only" visit in the morning at the clinic for lab draw in 6 months  - Make sure Lab Only appointment is at about 1 week before your next appointment, so that results will be available  Please schedule a Follow-up Appointment to: Return in about 6 months (around 05/18/2017) for Medicare Physical CPE.  If you have any other questions or concerns, please feel free to call the clinic or send a message through [place]. You may also schedule an earlier appointment if necessary.  Additionally, you may be receiving a survey about your experience at our clinic within a few days to 1 week by e-mail or mail. We value your feedback.  Nobie Putnam, DO [place]

## 2016-11-17 NOTE — Progress Notes (Signed)
Subjective:    Patient ID: Paul Lozano, male    DOB: 08-Jul-1945, 71 y.o.   MRN: [id number]  Paul Lozano is a 71 y.o. male presenting on 11/17/2016 for Arthritis  Accompanied by wife Joycelyn Schmid  HPI   Osteoarthritis Multiple joints (Hips ,Shoulders, hands) / History of GERD on NSAID - Reports chronic history of OA/DJD, old injuries and chronic wear and tear without any recent significant change, states no problems below hips, lumbar spine and lower feet/ankles. - He is currently doing well on Celebrex 257m daily. Not taking other NSAIDs. Has been on celebrex for years without history of PUD/ GIB, or other problem. He does take Omeprazole 281mdaily with NSAID nightly to help protect stomach has not had too many problems with GERD in general. - Not taking Tylenol regularly. Limited other therapy  BPH history of LUTS, controlled - Reports no new concerns. Has history of enlarged prostate. Improved urinary function on Flomax., tolerating well - Prior PSA, DRE normal, does not recall last - Needs refill Flomax - Denies dizziness, decreased urine output, difficulty voiding, significant nocturia, see BPH score  AUA BPH Symptom Score over past 1 month 1. Sensation of not emptying bladder post void - 1 2. Urinate less than 2 hour after finish last void - 0 3. Start/Stop several times during void - 1 4. Difficult to postpone urination - 0 5. Weak urinary stream - 1 6. Push or strain urination - 0 7. Nocturia - 0 times (previously had nocturia off med)  Total Score: 3 (Mild BPH symptoms)  Health Maintenance: - Due for Flu and PNeumonia vaccine - declines, never had before  [facility] 11/17/2016 02/03/2016 11/26/2014  Decreased Interest 0 0 0  Down, Depressed, Hopeless 0 0 0  PHQ - 2 Score 0 0 0    Social History  Substance Use Topics  . Smoking status: Current Every Day Smoker    Packs/day: 1.00    Years: 31.00    Types: Cigarettes  . Smokeless tobacco:  Current User  . Alcohol use No    Review of Systems Per HPI unless specifically indicated above     Objective:    BP 132/79   Pulse 61   Temp 97.7 F (36.5 C) (Oral)   Resp 16   Ht _0  (1.727 m)   Wt 160 lb 6.4 oz (72.8 kg)   BMI 24.39 kg/m   Wt Readings from Last 3 Encounters:  11/17/16 160 lb 6.4 oz (72.8 kg)  03/09/16 162 lb (73.5 kg)  02/03/16 165 lb (74.8 kg)    Physical Exam  Constitutional: He appears well-developed and well-nourished. No distress.  Well-appearing elderly [age]ear old male, comfortable, cooperative  HENT:  Head: Normocephalic and atraumatic.  Mouth/Throat: Oropharynx is clear and moist.  Frontal / maxillary sinuses non-tender. Nares patent without purulence or edema.  Right ear canal clear with normal appearing TM without effusion, erythema or bulging  Left ear is obscured by thick hard dry impacted cerumen.  Oropharynx clear without erythema, exudates, edema or asymmetry.  R external ear with posterior growth raised mild ulcerated chronic recurrent, concern skin cancer. Left ear similar location on helix with appears healed lesion  Eyes: Conjunctivae are normal. Right eye exhibits no discharge. Left eye exhibits no discharge.  Right eye chronic pupillary defect  Neck: Normal range of motion. Neck supple.  Cardiovascular: Normal rate, regular rhythm, normal heart sounds and intact distal pulses.   No murmur heard. Pulmonary/Chest:  Effort normal. No respiratory distress. He has no wheezes. He has no rales.  Reduced air movement at baseline, some scattered coarse sounds overall stable baseline  Musculoskeletal: Normal range of motion. He exhibits no edema.  Neurological: He is alert.  Skin: Skin is warm and dry. No rash noted. He is not diaphoretic. No erythema.  Psychiatric: He has a normal mood and affect. His behavior is normal.  Well groomed, good eye contact, normal speech and thoughts.  Nursing note and vitals reviewed.  Results for  orders placed or performed in visit on 02/24/16  COMPLETE METABOLIC PANEL WITH GFR  Result Value Ref Range   Sodium 142 135 - 146 mmol/L   Potassium 4.0 3.5 - 5.3 mmol/L   Chloride 110 98 - 110 mmol/L   CO2 28 20 - 31 mmol/L   Glucose, Bld 92 65 - 99 mg/dL   BUN 21 7 - 25 mg/dL   Creat 1.13 0.70 - 1.18 mg/dL   Total Bilirubin 0.8 0.2 - 1.2 mg/dL   Alkaline Phosphatase 78 40 - 115 U/L   AST 11 10 - 35 U/L   ALT 9 9 - 46 U/L   Total Protein 6.7 6.1 - 8.1 g/dL   Albumin 4.0 3.6 - 5.1 g/dL   Calcium 9.7 8.6 - 10.3 mg/dL   GFR, Est African American 75 >=60 mL/min   GFR, Est Non African American 65 >=60 mL/min  CBC with Differential/Platelet  Result Value Ref Range   WBC 6.2 3.8 - 10.8 K/uL   RBC 5.17 4.20 - 5.80 MIL/uL   Hemoglobin 16.1 13.2 - 17.1 g/dL   HCT 47.6 38.5 - 50.0 %   MCV 92.1 80.0 - 100.0 fL   MCH 31.1 27.0 - 33.0 pg   MCHC 33.8 32.0 - 36.0 g/dL   RDW 13.4 11.0 - 15.0 %   Platelets 182 140 - 400 K/uL   MPV 9.6 7.5 - 12.5 fL   Neutro Abs 3,844 1,500 - 7,800 cells/uL   Lymphs Abs 1,674 850 - 3,900 cells/uL   Monocytes Absolute 558 200 - 950 cells/uL   Eosinophils Absolute 124 15 - 500 cells/uL   Basophils Absolute 0 0 - 200 cells/uL   Neutrophils Relative % 62 %   Lymphocytes Relative 27 %   Monocytes Relative 9 %   Eosinophils Relative 2 %   Basophils Relative 0 %   Smear Review Criteria for review not met   Lipid panel  Result Value Ref Range   Cholesterol 132 <200 mg/dL   Triglycerides 175 (H) <150 mg/dL   HDL 38 (L) >40 mg/dL   Total CHOL/HDL Ratio 3.5 <5.0 Ratio   VLDL 35 (H) <30 mg/dL   LDL Cholesterol 59 <100 mg/dL      Assessment & Plan:   Problem List Items Addressed This Visit    Arthritis    Stable chronic OA/DJD multiple joints with chronic pain and flares episodic No recent trauma or injury No significant intervention such as surgery/arthroscopy Not followed by Ortho Last imaging not focused on OA no recent  Plan: 1. Refill Celebrex  211m daily - reviewed risks, side effects, complications and discussed my concern with chronic NSAID, but patient has much improved function on it and agrees to close lab monitoring yearly 2. Continue PPI while on NSAID 3. Increase regular Tylenol dosing 4. Consider add muscle relaxant or other med if need, vs topical treatments 5. Follow-up as needed monitor q 6-12 months, labs in 05/2017 Cr trend, future  may repeat X-rays      Relevant Medications   celecoxib (CELEBREX) 200 MG capsule   BPH (benign prostatic hyperplasia)    Stable chronic BPH w/ lower urinary tract symptoms (LUTS) and w/o evidence of obstruction. - AUA BPH score 3 (on med, no comparison) - On Flomax 0.33m - Last PSA unknown reported normal - No known personal/family history of prostate CA  Plan: 1. 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 Concern on chronic NSAID for OA/DJD, age, smoker Reviewed precautions, improve hydration Follow-up Cr in 6 months then q 12 mo or sooner if need      Gastroesophageal reflux disease    Stable, controlled on PPI Secondary to NSAID use chronic, uses PPI more protectively Refilled Omeprazole 2076maily      Relevant Medications   omeprazole (PRILOSEC) 20 MG capsule   Hearing loss of left ear due to cerumen impaction    Still L ear cerumen impacted, R ear normal Advised use home debrox and peroxide solution for flushing Can return if not successful      History of nonmelanoma skin cancer    Concern current lesion R ear helix may be a chronic skin cancer SCC vs BCC, chronic recurrent ulceration, non healing. Patient refuses treatment despite discussion. He had prior skin cancer treated other ear.         Meds ordered this encounter  Medications  . celecoxib (CELEBREX) 200 MG capsule    Sig: Take 1 capsule (200 mg total) by mouth daily.    Dispense:  90  capsule    Refill:  3  . tamsulosin (FLOMAX) 0.4 MG CAPS capsule    Sig: Take 1 capsule (0.4 mg total) by mouth at bedtime.    Dispense:  90 capsule    Refill:  3  . omeprazole (PRILOSEC) 20 MG capsule    Sig: Take 1 capsule (20 mg total) by mouth daily. With Celebrex    Dispense:  90 capsule    Refill:  3    Follow up plan: Return in about 6 months (around 05/18/2017) for Medicare Physical CPE.  AleNobie PutnamO [facility] 11/18/2016, 8:21 AM

## 2016-11-18 ENCOUNTER — Other Ambulatory Visit: Payer: Self-pay | Admitting: Family Medicine

## 2016-11-18 DIAGNOSIS — N182 Chronic kidney disease, stage 2 (mild): Secondary | ICD-10-CM

## 2016-11-18 DIAGNOSIS — Z125 Encounter for screening for malignant neoplasm of prostate: Secondary | ICD-10-CM

## 2016-11-18 DIAGNOSIS — N401 Enlarged prostate with lower urinary tract symptoms: Secondary | ICD-10-CM

## 2016-11-18 DIAGNOSIS — E782 Mixed hyperlipidemia: Secondary | ICD-10-CM

## 2016-11-18 DIAGNOSIS — E785 Hyperlipidemia, unspecified: Secondary | ICD-10-CM | POA: Insufficient documentation

## 2016-11-18 DIAGNOSIS — R7309 Other abnormal glucose: Secondary | ICD-10-CM

## 2016-11-18 DIAGNOSIS — Z85828 Personal history of other malignant neoplasm of skin: Secondary | ICD-10-CM | POA: Insufficient documentation

## 2016-11-18 DIAGNOSIS — K219 Gastro-esophageal reflux disease without esophagitis: Secondary | ICD-10-CM

## 2016-11-18 NOTE — Assessment & Plan Note (Signed)
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 - AUA BPH score 3 (on med, no comparison) - On Flomax 0.4mg  - Last PSA unknown reported normal - No known personal/family history of prostate CA  Plan: 1. Refilled Tamsulosin 0.4mg  daily

## 2016-11-18 NOTE — Assessment & Plan Note (Signed)
Concern current lesion R ear helix may be a chronic skin cancer SCC vs BCC, chronic recurrent ulceration, non healing. Patient refuses treatment despite discussion. He had prior skin cancer treated other ear.

## 2016-11-18 NOTE — Assessment & Plan Note (Signed)
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

## 2016-11-18 NOTE — Assessment & Plan Note (Signed)
Still L ear cerumen impacted, R ear normal Advised use home debrox and peroxide solution for flushing Can return if not successful

## 2016-11-18 NOTE — Assessment & Plan Note (Signed)
Stable, controlled on PPI Secondary to NSAID use chronic, uses PPI more protectively Refilled Omeprazole 20mg  daily

## 2016-11-18 NOTE — Assessment & Plan Note (Signed)
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 Refill Celebrex 200mg  daily - reviewed risks, side effects, complications and discussed my concern with chronic NSAID, but patient has much improved function on it and agrees to close lab monitoring yearly 2. Continue PPI while on NSAID 3. Increase regular Tylenol dosing 4. Consider add muscle relaxant or other med if need, vs topical treatments 5. Follow-up as needed monitor q 6-12 months, labs in 05/2017 Cr trend, future may repeat X-rays

## 2017-04-27 ENCOUNTER — Other Ambulatory Visit: Payer: Self-pay | Admitting: Family Medicine

## 2017-04-27 ENCOUNTER — Encounter: Payer: Self-pay | Admitting: Family Medicine

## 2017-04-27 ENCOUNTER — Ambulatory Visit: Payer: Medicare Other | Admitting: Family Medicine

## 2017-04-27 VITALS — BP 121/82 | HR 75 | Temp 97.7°F | Resp 16 | Ht 68.0 in | Wt 160.0 lb

## 2017-04-27 DIAGNOSIS — R310 Gross hematuria: Secondary | ICD-10-CM | POA: Diagnosis not present

## 2017-04-27 DIAGNOSIS — N200 Calculus of kidney: Secondary | ICD-10-CM

## 2017-04-27 DIAGNOSIS — N390 Urinary tract infection, site not specified: Secondary | ICD-10-CM

## 2017-04-27 DIAGNOSIS — R350 Frequency of micturition: Secondary | ICD-10-CM

## 2017-04-27 DIAGNOSIS — R319 Hematuria, unspecified: Secondary | ICD-10-CM

## 2017-04-27 DIAGNOSIS — N401 Enlarged prostate with lower urinary tract symptoms: Secondary | ICD-10-CM | POA: Diagnosis not present

## 2017-04-27 LAB — POCT URINALYSIS DIPSTICK
BILIRUBIN UA: NEGATIVE
GLUCOSE UA: NEGATIVE
KETONES UA: NEGATIVE
NITRITE UA: NEGATIVE
PROTEIN UA: 30
SPEC GRAV UA: 1.01 (ref 1.010–1.025)
Urobilinogen, UA: 4 E.U./dL — AB
pH, UA: 7.5 (ref 5.0–8.0)

## 2017-04-27 MED ORDER — CIPROFLOXACIN HCL 500 MG PO TABS: 500.0000 mg | ORAL_TABLET | Freq: Two times a day (BID) | ORAL | 0 refills | Status: DC

## 2017-04-27 MED ORDER — BACLOFEN 10 MG PO TABS: 5.0000 mg | ORAL_TABLET | Freq: Three times a day (TID) | ORAL | 0 refills | Status: DC | PRN

## 2017-04-27 NOTE — Patient Instructions (Addendum)
Thank you for coming to the office today.  1.  I am concerned about possible UTI or bladder infection, you do not have symptoms of a kidney infection at this time.  We will send urine for culture - stay tuned for results within 48 hours or so, if we need to adjust antibiotic  Start Cipro twice daily antibiotic times daily for 7 days - finish entire course  Also if difficulty urinating may take 2nd dose Flomax 0.4mg  daily for help improve urine flow  You most likely have multiple Kidney Stones (Nephrolithiasis)  It can cause flank or side pain, abdominal pain, or pain with urination. Also blood in urine is very common.  Start taking Baclofen (Lioresal) 10mg  (muscle relaxant) - start with one pill at night as needed for next 1-3 nights (may make you drowsy, caution with driving) see how it affects you, then if tolerated increase to one pill 2 to 3 times a day or (every 8 hours as needed)  Very important to see Urologist as scheduled and discuss removal options and they may repeat imaging  May switch to Dr Jacqlyn Larsen as planned, try to see them within few weeks or sooner, especially if not improved, may need Cystoscopy due to Gross Hematuria  If you get any of the following it is more of an emergency and may need to go to hospital directly or we can try to contact Urology sooner  - Fever, chills sweats nausea vomiting cannot tolerate antibiotic - Cannot pee or void at all for 12 hours straight, despite straining and medicine - Worsening kidney function Creatinine - based on lab test today  Please schedule a Follow-up Appointment to: Return if symptoms worsen or fail to improve, for UTI kidney stone.  If you have any other questions or concerns, please feel free to call the office or send a message through [place]. You may also schedule an earlier appointment if necessary.  Additionally, you may be receiving a survey about your experience at our office within a few days to 1 week by e-mail or  mail. We value your feedback.  Nobie Putnam, DO [place]

## 2017-04-27 NOTE — Progress Notes (Signed)
Subjective:    Patient ID: Paul Lozano, male    DOB: August 02, 1945, 72 y.o.   MRN: [id number]  Paul Lozano is a 72 y.o. male presenting on 04/27/2017 for Urinary Tract Infection and Nephrolithiasis  Patient presents for a same day appointment.  Patient accompanied by wife, Joycelyn Schmid, who provides additional history.  HPI   UTI vs Nephrolithiasis, URINARY FREQUENCY / GROSS HEMATURIA Reported symptoms started about 1 week ago, he had urinary urgency and had a void but had significant amount of blood with "bright red blood in urine" consistent with gross hematuria by his description, about 1 hour later had another significant urge to urinate. He is endorsing urinary frequency and urgency, every 1-2 hours or so, amount of urine can be small other times will have higher volume. - Admits dysuria with burning after passing - Additionally he has passed kidney stones several times monthly since 1st of year 2019, January through now, multiple small stones, looked at today in office, they have kept. He has been followed by [facility], previously followed by Dr Bernardo Heater, but now plans to change providers at that same office now to Dr Edrick Oh. He attempted to call them but they are unavailable and out of office due to out for conference. - Prior Urology history with stones, s/p ureteroscopy / cystoscopy and lithotripsy - Taking Tamsulosin 0.4mg , with history of BPH - Admits intermittent episodes of bilateral flank pain, mostly L > R, he has an older supply of Oxycodone PRN rarely uses - Worse with trigger riding lawn mower, seems to trigger more stones or loosening of stones - Denies fevers, chills, sweats, nausea vomiting, urinary obstruction / reduced output   Depression screen [facility] 2/9 11/17/2016 02/03/2016 11/26/2014  Decreased Interest 0 0 0  Down, Depressed, Hopeless 0 0 0  PHQ - 2 Score 0 0 0    Social History   Tobacco Use  . Smoking status: Current Every Day Smoker     Packs/day: 1.00    Years: 31.00    Pack years: 31.00    Types: Cigarettes  . Smokeless tobacco: Current User  Substance Use Topics  . Alcohol use: No    Alcohol/week: 0.0 oz  . Drug use: No    Review of Systems Per HPI unless specifically indicated above     Objective:    BP 121/82   Pulse 75   Temp 97.7 F (36.5 C) (Oral)   Resp 16   Ht 5\' 8"  (1.727 m)   Wt 160 lb (72.6 kg)   BMI 24.33 kg/m   Wt Readings from Last 3 Encounters:  04/27/17 160 lb (72.6 kg)  11/17/16 160 lb 6.4 oz (72.8 kg)  03/09/16 162 lb (73.5 kg)    Physical Exam  Constitutional: He is oriented to person, place, and time. He appears well-developed and well-nourished. No distress.  Well-appearing, comfortable, cooperative  HENT:  Head: Normocephalic and atraumatic.  Mouth/Throat: Oropharynx is clear and moist.  Eyes: Conjunctivae are normal. Right eye exhibits no discharge. Left eye exhibits no discharge.  Cardiovascular: Normal rate, regular rhythm, normal heart sounds and intact distal pulses.  No murmur heard. Pulmonary/Chest: Effort normal and breath sounds normal. No respiratory distress. He has no wheezes. He has no rales.  Abdominal: Bowel sounds are normal. He exhibits no distension. There is no tenderness.  Musculoskeletal: Normal range of motion. He exhibits no edema.  No CVAT or back or flank pain  Neurological: He is alert and oriented to  person, place, and time.  Skin: Skin is warm and dry. No rash noted. He is not diaphoretic. No erythema.  Psychiatric: His behavior is normal.  Nursing note and vitals reviewed.  Results for orders placed or performed in visit on 04/27/17  POCT Urinalysis Dipstick  Result Value Ref Range   Color, UA dark amber    Clarity, UA clear    Glucose, UA negative    Bilirubin, UA negative    Ketones, UA negative    Spec Grav, UA 1.010 1.010 - 1.025   Blood, UA large    pH, UA 7.5 5.0 - 8.0   Protein, UA 30    Urobilinogen, UA 4.0 (A) 0.2 or 1.0  E.U./dL   Nitrite, UA negative    Leukocytes, UA Small (1+) (A) Negative   Appearance clear    Odor foul       Assessment & Plan:   Problem List Items Addressed This Visit    BPH (benign prostatic hyperplasia)   Calculus of kidney   Relevant Medications   baclofen (LIORESAL) 10 MG tablet   Gross hematuria    Other Visit Diagnoses    Urinary tract infection with hematuria, site unspecified    -  Primary   Relevant Medications   ciprofloxacin (CIPRO) 500 MG tablet   Other Relevant Orders   POCT Urinalysis Dipstick (Completed)   CULTURE, URINE COMPREHENSIVE      Clinically consistent with UTI, supported by UA. No recent UTIs or abx courses.  Concern with gross hematuria, likely from frequent nephrolithiasis passing No signs of ascending infection or pyelo today (no systemic symptoms, neg fever, back pain, n/v).  Plan: 1. S/p AZO at home affecting UA dipstick - but still large blood and leuk est 2. Ordered Urine culture 3. Cipro 500mg  BID x 7 days 4. Improve PO hydration - For Stone - continue Tamsulosin, may double up dose if need for short term if reduced urine flow - Deferred KUB X-ray today, unlikely to change current course/plan at this time - Rx Baclofen PRN for muscle spasms and assist with passing stones - Reviewed strict return criteria if worsening in setting of stone/UTI - Agree with follow-up with Urology - return to [facility] Dr Jacqlyn Larsen at [employer], especially if not improved   Meds ordered this encounter  Medications  . ciprofloxacin (CIPRO) 500 MG tablet    Sig: Take 1 tablet (500 mg total) by mouth 2 (two) times daily.    Dispense:  14 tablet    Refill:  0  . baclofen (LIORESAL) 10 MG tablet    Sig: Take 0.5-1 tablets (5-10 mg total) by mouth 3 (three) times daily as needed for muscle spasms (flank pain kidney stone).    Dispense:  30 each    Refill:  0    Follow up plan: Return if symptoms worsen or fail to improve, for UTI kidney  stone.  Nobie Putnam, [facility] 04/27/2017, 11:35 PM

## 2017-04-29 ENCOUNTER — Telehealth: Payer: Self-pay | Admitting: Family Medicine

## 2017-04-29 LAB — CULTURE, URINE COMPREHENSIVE
MICRO NUMBER:: 90314412
SPECIMEN QUALITY:: ADEQUATE

## 2017-04-29 NOTE — Telephone Encounter (Signed)
Left message for patient to call back  

## 2017-04-29 NOTE — Telephone Encounter (Signed)
Spoke to spouse result has not finalized so advised to continue antibiotics as prescribed.

## 2017-04-29 NOTE — Telephone Encounter (Signed)
Pt's wife called for urine results [phone]

## 2017-05-04 ENCOUNTER — Other Ambulatory Visit: Payer: Self-pay | Admitting: Family Medicine

## 2017-05-04 DIAGNOSIS — N200 Calculus of kidney: Secondary | ICD-10-CM

## 2017-05-18 ENCOUNTER — Other Ambulatory Visit: Payer: Medicare Other

## 2017-05-21 ENCOUNTER — Ambulatory Visit: Payer: Medicare Other | Admitting: Family Medicine

## 2017-05-31 ENCOUNTER — Telehealth: Payer: Self-pay | Admitting: Family Medicine

## 2017-05-31 NOTE — Telephone Encounter (Signed)
LM to see if labs could be changed to 01/18/18 so that [place] w [facility] could be done on same day prior to physical the following week

## 2017-10-28 ENCOUNTER — Telehealth: Payer: Self-pay | Admitting: Family Medicine

## 2017-10-28 ENCOUNTER — Encounter: Payer: Self-pay | Admitting: Family Medicine

## 2017-10-28 DIAGNOSIS — R7309 Other abnormal glucose: Secondary | ICD-10-CM | POA: Insufficient documentation

## 2017-10-28 NOTE — Telephone Encounter (Signed)
Pt is sched to see Dr. Raliegh Ip for follow up and Rx prescription refills on 12/4. Wanted to cancel labs as he has had multiple tests done throughout the year through [facility]. Please call if you would like pts to have labs completed prior to visit on 12/4. Thank you, KNB

## 2017-10-28 NOTE — Telephone Encounter (Signed)
Called patient, spoke with wife, Joycelyn Schmid, he wants to decline labs since he has had so much blood drawn from [facility] over past year. I reviewed care everywhere, there are several Chemistry and CBC and Urine tests. I explained that I am recommending routine yearly screening labs for different things - such as Sugar A1c, Lipid panel, and PSA prostate. I was unable to find a PSA lab from [facility].  They were still hesitant and not wanting to come for separate appointment or in morning for fasting labs. Therefore, given he is not on Statin or not diagnosed with Pre-Diabetes or DM. I agreed to cancel the labs, and we can compromise with a POC A1c test at time of his visit on 12/4.  In future if he is interested in PSA he should request from [place] 10/28/2017, 2:12 PM

## 2017-11-11 MED ORDER — PANTOPRAZOLE SODIUM 20 MG PO TBEC
20.00 | DELAYED_RELEASE_TABLET | ORAL | Status: DC
Start: 2017-11-12 — End: 2017-11-11

## 2017-11-11 MED ORDER — AMOXICILLIN-POT CLAVULANATE 875-125 MG PO TABS
1.00 | ORAL_TABLET | ORAL | Status: DC
Start: 2017-11-11 — End: 2017-11-11

## 2017-11-11 MED ORDER — NICOTINE 7 MG/24HR TD PT24
1.00 | MEDICATED_PATCH | TRANSDERMAL | Status: DC
Start: ? — End: 2017-11-11

## 2017-11-11 MED ORDER — ENOXAPARIN SODIUM 40 MG/0.4ML ~~LOC~~ SOLN
40.00 | SUBCUTANEOUS | Status: DC
Start: 2017-11-11 — End: 2017-11-11

## 2017-11-11 MED ORDER — TAMSULOSIN HCL 0.4 MG PO CAPS
0.80 | ORAL_CAPSULE | ORAL | Status: DC
Start: 2017-11-12 — End: 2017-11-11

## 2017-11-19 ENCOUNTER — Encounter: Payer: Self-pay | Admitting: Family Medicine

## 2017-11-19 ENCOUNTER — Ambulatory Visit: Payer: Medicare Other | Admitting: Family Medicine

## 2017-11-19 ENCOUNTER — Telehealth: Payer: Self-pay

## 2017-11-19 VITALS — BP 123/69 | HR 88 | Temp 97.9°F | Resp 16 | Ht 68.0 in | Wt 147.8 lb

## 2017-11-19 DIAGNOSIS — R3912 Poor urinary stream: Secondary | ICD-10-CM

## 2017-11-19 DIAGNOSIS — J189 Pneumonia, unspecified organism: Secondary | ICD-10-CM | POA: Diagnosis not present

## 2017-11-19 DIAGNOSIS — K219 Gastro-esophageal reflux disease without esophagitis: Secondary | ICD-10-CM

## 2017-11-19 DIAGNOSIS — C3491 Malignant neoplasm of unspecified part of right bronchus or lung: Secondary | ICD-10-CM | POA: Insufficient documentation

## 2017-11-19 DIAGNOSIS — M1990 Unspecified osteoarthritis, unspecified site: Secondary | ICD-10-CM

## 2017-11-19 DIAGNOSIS — N401 Enlarged prostate with lower urinary tract symptoms: Secondary | ICD-10-CM

## 2017-11-19 DIAGNOSIS — J302 Other seasonal allergic rhinitis: Secondary | ICD-10-CM

## 2017-11-19 MED ORDER — FLUTICASONE PROPIONATE 50 MCG/ACT NA SUSP: 2.0000 | Freq: Every day | NASAL | 3 refills | Status: DC

## 2017-11-19 MED ORDER — CELECOXIB 200 MG PO CAPS: 200.0000 mg | ORAL_CAPSULE | Freq: Every day | ORAL | 3 refills | Status: DC

## 2017-11-19 MED ORDER — TAMSULOSIN HCL 0.4 MG PO CAPS: 0.8000 mg | ORAL_CAPSULE | Freq: Every day | ORAL | 3 refills | Status: DC

## 2017-11-19 MED ORDER — MAGNESIUM OXIDE 400 MG PO TABS: 400.0000 mg | ORAL_TABLET | Freq: Every day | ORAL | 3 refills | Status: AC

## 2017-11-19 MED ORDER — LORATADINE 10 MG PO TABS: 10.0000 mg | ORAL_TABLET | Freq: Every day | ORAL | 3 refills | Status: AC

## 2017-11-19 MED ORDER — OMEPRAZOLE 20 MG PO CPDR: 20.0000 mg | DELAYED_RELEASE_CAPSULE | Freq: Every day | ORAL | 3 refills | Status: DC

## 2017-11-19 NOTE — Progress Notes (Signed)
Subjective:    Patient ID: Paul Lozano, male    DOB: [birth year], 72 y.o.   MRN: [id number]  Paul Lozano is a 72 y.o. male presenting on 11/19/2017 for Hospitalization Follow-up and Lung Cancer  History provided by patient and also by wife, Joycelyn Schmid today.  HPI   HOSPITAL FOLLOW-UP VISIT  Hospital/Location: [facility] Date of Admission: 11/08/17 Date of Discharge: 11/11/17 Transitions of care telephone call: Not completed  Reason for Admission: Pneumonia / URI Primary (+Secondary) Diagnosis: RUL Lung Mass, presumed Stage 4 Lung Cancer, Obstructive Pneumonia  FOLLOW-UP - Hospital H&P and Discharge Summary have been reviewed - Patient presents today about 8 days after recent hospitalization. Brief summary of recent course, patient had symptoms of fever and sweats with URI symptoms recently over prior weekend also other significant history of unexplained 40 lb wt loss in 6 months, overall did not feel well, and they contacted nurse triage line [facility], she took him to [facility] ED as advised by triage, he had imaging CXR showed RUL/RML pneumonia, and abnormal labs, and then imaging with CT imaging showed a RUL / R Perihilar mass and concern dx of post obstructive consolidation. He was given IV antibiotics. Also found a lytic lesion T4 vertebral body concerning for osseous met. He was transferred to [facility] to get bronchoscopy / biopsy diagnosis, started on Ceftriaxone, Azithromcyin and Flagyl 9/23 then transitioned to oral Augmentin 9/25. Additional lab testing showed elevated Calcium, thought related to malignancy. Ultimately he improved but still felt low energy. Other testing and imaging showed no other mets except those listed. - Scheduled follow-up at [facility] on 11/23/17 with Rad Onc Dr Wonda Olds / Medical Oncology Dr Wendee Beavers - low PTH and elevated related-peptide PTH (11). 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 He still has reduced appetite and energy but overall seems to have resolved fever and cough / dyspnea symptoms now doing better.  - New medications on discharge: Augmentin, finished.  He has quit smoking during hospitalization as well. Doing well with this.  History of kidney stones. History of high calcium. She has started him on Magnesium to help reduce this problem. Admits some increased bowel movements due to magnesium now with good results, history of constipation.  Admits reduced appetite Denies fever, chills, sweats, cough, dyspnea  Depression screen [facility] 2/9 11/19/2017 11/17/2016 02/03/2016  Decreased Interest 0 0 0  Down, Depressed, Hopeless 0 0 0  PHQ - 2 Score 0 0 0    Social History   Tobacco Use  . Smoking status: Former Smoker    Packs/day: 1.00    Years: 31.00    Pack years: 31.00    Types: Cigarettes    Last attempt to quit: 11/08/2017    Years since quitting: 0.0  . Smokeless tobacco: Former Network engineer Use Topics  . Alcohol use: No    Alcohol/week: 0.0 standard drinks  . Drug use: No    Review of Systems Per HPI unless specifically indicated above     Objective:    BP 123/69   Pulse 88   Temp 97.9 F (36.6 C) (Oral)   Resp 16   Ht 5' 8" (1.727 m)   Wt 147 lb 12.8 oz (67 kg)   BMI 22.47 kg/m   Wt Readings from Last 3 Encounters:  11/19/17 147  lb 12.8 oz (67 kg)  04/27/17 160 lb (72.6 kg)  11/17/16 160 lb 6.4 oz (72.8 kg)    Physical Exam  Constitutional: He is oriented to person, place, and time. He appears well-developed and well-nourished. No distress.  Well-appearing thin, comfortable, cooperative  HENT:  Head: Normocephalic and atraumatic.  Mouth/Throat: Oropharynx is clear and moist.  Eyes: Conjunctivae are normal. Right eye exhibits no discharge. Left eye exhibits no discharge.  Right eye chronic pupillary  defect   Cardiovascular: Normal rate, regular rhythm, normal heart sounds and intact distal pulses.  No murmur heard. Pulmonary/Chest: Effort normal and breath sounds normal. No respiratory distress. He has no wheezes. He has no rales.  No significant diminished breath sounds on exam, no focal crackles at this time. No wheezing. Good air movement.  Musculoskeletal: Normal range of motion. He exhibits no edema.  Neurological: He is alert and oriented to person, place, and time.  Skin: Skin is warm and dry. No rash noted. He is not diaphoretic. No erythema.  Psychiatric: He has a normal mood and affect. His behavior is normal.  Well groomed, good eye contact, normal speech and thoughts  Nursing note and vitals reviewed.   I have personally reviewed the radiology report from 11/08/17.  Interface, Rad Results In - 11/08/2017  8:31 AM EDT EXAM: CTA CHEST W CONTRAST DATE: 11/08/2017 4:54 AM ACCESSION: [id number] UN DICTATED: 11/08/2017 5:00 AM INTERPRETATION LOCATION: [place]  CLINICAL INDICATION: 72 years old Male with Evaluate for lung mass -  weight loss with possible postobstructive pneumonia now; also eval for PE pleuritic pain    COMPARISON: Chest radiograph 11/08/2017  TECHNIQUE: A spiral CTA scan was obtained with IV contrast from the lung apices to the lung bases. Images were reconstructed in the axial plane. Multiplanar reformatted and MIP images were provided.  FINDINGS:  PULMONARY ARTERIES: No pulmonary embolism. There is masslike soft tissue encasement of the interlobar and right upper lobe pulmonary arteries  AIRWAYS, LUNGS, PLEURA: Debris within the bronchus intermedius. The right upper lobe bronchus is obstructed. There is a perihilar mass involving the predominantly right upper and partially right lower lobes with pulmonary artery encasement measuring approximately 5.2 x 5.9 cm in greatest axial dimensions. There is obstruction of the right upper lobe bronchus and distal  airways resulting in patchy consolidations in the right upper lobe as well as tree-in-bud opacities in the right lower lobe. Bilateral peribronchial thickening. Emphysema. No pleural effusions.  MEDIASTINUM: Normal heart size. No pericardial effusion.  Normal caliber thoracic aorta.  No mediastinal lymphadenopathy by size criteria although several prominent subcentimeter mediastinal nodes are noted. 1.3 cm hypodense right thyroid lobe nodule.  IMAGED ABDOMEN: Mild circumferential thickening of the esophagus.Marland Kitchen  SOFT TISSUES: Mild gynecomastia.  BONES: Remote right posterior rib fractures. No worrisome osseous lesions..  IMPRESSION: --No pulmonary embolism. --Right perihilar/upper lobe mass partially encasing the right upper lobar and interlobar pulmonary arteries concerning for neoplasm. Consider tissue sampling and/or PET/CT for further evaluation. --Postobstructive consolidations in the right upper lobe and tree-in-bud opacities in the right lower lobe. Cannot exclude underlying infection. --Emphysema and bilateral peribronchial thickening which may be related to chronic airway inflammation. --Mild circumferential thickening of the esophagus which may be related to reflux. Consider EGD as clinically indicated. --Indeterminate 1.3 cm right thyroid lobe nodule. Recommend dedicated thyroid ultrasound.  ATTENDING ADDENDUM:  Right perihilar mass extending into the right upper lobe and superior segment of right lower lobe. The mass lesion obstructs the right upper lobe airways with partial  postobstructive atelectasis of right upper lobe, consistent with primary lung neoplasm.  Lytic lesion involving the left transverse process, lamina and T4 vertebral body, concerning for osseous metastases.  Multiple tiny nodular opacities in right upper lobe, indeterminate but likely secondary to postobstructive pneumonia.     -------------------  Interface, Rad Results In - 11/10/2017  9:35 AM EDT EXAM:  Positron emission tomography (PET) with concurrently acquired computed tomography (CT) for attenuation correction and anatomic localization: PET CT SKULL BASE TO THIGH DATE: 11/10/2017 8:55 AM ACCESSION: [id number] UN DICTATED: 11/10/2017 9:02 AM INTERPRETATION LOCATION: [place]  CLINICAL INDICATION: 72 years old Male: [place].  Initial treatment strategy.  RADIOPHARMACEUTICAL: F-18 Fluorodeoxyglucose (FDG), IV  TECHNIQUE: Following the administration of radiopharmaceutical, PET images were acquired using 3D-acquisition and reconstructed with attenuation-correction.  A single-breathhold CT scan was obtained at quiet end-expiration without oral or IV contrast for anatomic localization and attenuation-correction. The coregistered PET and CT images were evaluated in axial, coronal, and sagittal planes.  Scanner: Siemens Biograph mCT  Serum glucose: 101 mg/dL Injected activity: 10.27 mCi Site of injection: Left AC Uptake time: 58 minutes  COMPARISON: CTA chest 11/08/2017  FINDINGS:  Head/Neck: - No abnormal focal radiotracer uptake  Chest: - Axillae: No adenopathy - Lungs: A large, intensely avid right perihilar/upper lobe encases and at least partially occludes the right middle and upper lobe bronchi with associated distal post obstructive atelectasis. - Mediastinum/hila: No FDG avid mediastinal adenopathy. A subcentimeter right supraclavicular, mildly avid lymph node (CT 38). - Pleura: No effusions  Abdomen/Pelvis: - Liver: No focal abnormality - Gallbladder: Unremarkable - Spleen: No focal abnormalities. - Pancreas: No focal abnormality - Adrenal glands: Unremarkable - Kidneys: Unremarkable - GI Tract: Unremarkable - GU Tract: Residual contrast in the left renal collecting system, limiting evaluation of previously demonstrated left nephrolithiasis. Right kidney is unremarkable. Left renal cyst. Prostate calcifications. - Adenopathy: None  MUSCULOSKELETAL: -  Intensely avid lytic lesion in the left T4 pedicle and vertebral body (CT 47).  IMPRESSION: - Large, intensely avid right perihilar/upper lobe mass, worrisome for primary lung malignancy.  - Intensely avid lytic lesion in the left T4 pedicle/vertebral body, compatible with metastatic osseous disease.  - Mildly avid subcentimeter right supraclavicular lymph node, worrisome for metastasis.  ---------------------  Result Impression   -- No evidence of metastatic disease. -- Diffuse cerebral atrophy in a nonspecific distribution. Mild leukoaraiosis, nonspecific, but most commonly associated with small vessel ischemic changes. -- Pansinusitis.  Result Narrative  EXAM: Magnetic resonance imaging, brain, without and with contrast material. DATE: 11/10/2017 10:02 AM ACCESSION: [id number] UN DICTATED: 11/10/2017 10:30 AM INTERPRETATION LOCATION: [place]  CLINICAL INDICATION: 71 years old Male with MALIG NEOPLASM (Other site - Specify in comments) -pulmonary  COMPARISON: Same-day PET/CT.  TECHNIQUE: Multiplanar, multisequence MR imaging of the brain was performed without and with I.V. contrast.  FINDINGS:  Prominent CSF spaces, including the sulci and ventricles, distribution nonspecific. Scattered and confluent foci of increased T2/FLAIR signal in the periventricular and deep white matter are nonspecific, but most commonly associated with small vessel ischemic disease (mild).   No enhancing lesions identified..There is no midline shift or mass lesion. There is no evidence of intracranial hemorrhage or acute infarct.There are no extra-axial fluid collections present.No diffusion weighted signal abnormality is identified.  There is no abnormal enhancement.  Diffuse mild to moderate mucosal thickening of the paranasal sinuses. There is near-complete opacification of the left sphenoid sinus.      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, right ([place]) - Primary    Other Visit Diagnoses    Postobstructive pneumonia          Clinically improving from postobstructive PNA secondary to newly diagnosed RUL lung cancer, Stage IV with mets SCC Likely recent course is exacerbation from obstruction of mass and other constellation symptoms reduced appetite, weight loss, energy may be related to both infection and also malignancy - Long history of smoking, now former smoker quit in hospital, remains smoke free - Suspect some underlying cognitive problem still improving after acute hospitalization and illness - Hemodynamically stable, resp status improved, afebrile  Plan Agree with current plan to pursue [facility] Oncology management as scheduled 11/23/17 with Med/Rad Onc specialists to develop initial consultation and treatment plan now that he has already completed FNA bronch biopsy. - Reviewed general approach to newly diagnosed cancer and offered support in various ways, discussed Oncology team approach and next step is to learn more about his cancer and treatment options - No repeat course of antibiotics needed at this time - Follow-up as needed or within 6 wk to 3 months to review future treatment plans  Deferred repeat A1c POC measurement, we considered this since he declined full lab draw recently since already had blood work at [facility], but plan to get this in future. Only has history of mild elevated glucose but not prior A1c or dx of PreDM  No orders of the defined types were placed in this encounter.  FOR A&P I have reviewed the discharge medication list, and have reconciled the current and discharge medications today.   Current Outpatient Medications:  .  baclofen (LIORESAL) 10 MG tablet, TAKE ONE-HALF TO 1 TABLET BY MOUTH THREE TIMES DAILY AS NEEDED FOR MUSCLE SPASMS(FLANK PAIN, KIDNEY STONE), Disp: 30 tablet, Rfl: 0 .  celecoxib (CELEBREX) 200 MG capsule, Take 1 capsule (200 mg total) by mouth  daily., Disp: 90 capsule, Rfl: 3 .  Cholecalciferol (VITAMIN D-3 PO), Take by mouth., Disp: , Rfl:  .  Loratadine-Pseudoephedrine (PX ALLERGY RELIEF D, LORATID, PO), Take by mouth daily., Disp: , Rfl:  .  Magnesium Oxide (MAG-CAPS PO), Take by mouth., Disp: , Rfl:  .  omeprazole (PRILOSEC) 20 MG capsule, Take 1 capsule (20 mg total) by mouth daily. With Celebrex, Disp: 90 capsule, Rfl: 3 .  tamsulosin (FLOMAX) 0.4 MG CAPS capsule, Take 1 capsule (0.4 mg total) by mouth at bedtime., Disp: 90 capsule, Rfl: 3   Follow up plan: Return in about 3 months (around 02/19/2018) for 3 month follow-up check POC A1c / follow-up [facility] Oncology.  Nobie Putnam, [facility] 11/19/2017, 10:10 AM

## 2017-11-19 NOTE — Patient Instructions (Addendum)
Thank you for coming to the office today.  Follow-up with [facility] Oncology as scheduled to learn more and determine a treatment plan with options.  Congratulations on quitting smoking.  Focus on maintaining your mental health and physical health as well. Try to stay hydrated and maintain nutrition as best you can, focus on high calorie count and protein foods for now.  Please schedule a Follow-up Appointment to: Return in about 3 months (around 02/19/2018) for 3 month follow-up check POC A1c / follow-up [facility] Oncology.  If you have any other questions or concerns, please feel free to call the office or send a message through [place]. You may also schedule an earlier appointment if necessary.  Additionally, you may be receiving a survey about your experience at our office within a few days to 1 week by e-mail or mail. We value your feedback.  Nobie Putnam, DO [place]

## 2017-11-19 NOTE — Addendum Note (Signed)
Addended by: Olin Hauser on: 11/19/2017 05:41 PM   Modules accepted: Orders

## 2017-11-19 NOTE — Telephone Encounter (Signed)
All meds refilled as requested  Nobie Putnam, DO [facility] 11/19/2017, 5:41 PM

## 2017-11-19 NOTE — Telephone Encounter (Signed)
Patient's spouse is requesting refill for fluticasone, loratadine, magnesium taking OTC 400 mg but now want Rx so can get at cheaper rate, and tamsulosin 0.8 mg, omeprazole  because he is taking 0.4 Unknown twice.

## 2017-12-06 ENCOUNTER — Telehealth: Payer: Self-pay | Admitting: Family Medicine

## 2017-12-06 DIAGNOSIS — G8929 Other chronic pain: Secondary | ICD-10-CM

## 2017-12-06 DIAGNOSIS — M545 Low back pain, unspecified: Secondary | ICD-10-CM

## 2017-12-06 MED ORDER — BACLOFEN 10 MG PO TABS: 10.0000 mg | ORAL_TABLET | Freq: Two times a day (BID) | ORAL | 3 refills | Status: DC | PRN

## 2017-12-06 NOTE — Telephone Encounter (Signed)
Pt's wife asked to a refill on baclofen 90 day supply for 1 year sent to [facility] in [place]..  Her call back number is [phone]

## 2017-12-06 NOTE — Telephone Encounter (Signed)
Refilled

## 2018-01-18 ENCOUNTER — Other Ambulatory Visit: Payer: Medicare Other

## 2018-01-18 ENCOUNTER — Ambulatory Visit: Payer: Medicare Other

## 2018-01-19 ENCOUNTER — Ambulatory Visit: Payer: Medicare Other

## 2018-01-19 ENCOUNTER — Ambulatory Visit: Payer: Medicare Other | Admitting: Family Medicine

## 2018-01-21 ENCOUNTER — Other Ambulatory Visit: Payer: Medicare Other

## 2018-01-26 ENCOUNTER — Ambulatory Visit: Payer: Medicare Other | Admitting: Family Medicine

## 2018-01-31 ENCOUNTER — Other Ambulatory Visit: Payer: Self-pay | Admitting: Family Medicine

## 2018-01-31 ENCOUNTER — Telehealth: Payer: Self-pay | Admitting: Family Medicine

## 2018-01-31 DIAGNOSIS — J302 Other seasonal allergic rhinitis: Secondary | ICD-10-CM

## 2018-01-31 MED ORDER — FLUTICASONE PROPIONATE 50 MCG/ACT NA SUSP: 2.0000 | Freq: Every day | NASAL | 3 refills | Status: DC

## 2018-01-31 NOTE — Telephone Encounter (Signed)
Refill send.

## 2018-01-31 NOTE — Telephone Encounter (Signed)
Pt needs a refill on flonase sent to [facility].

## 2018-02-01 ENCOUNTER — Telehealth: Payer: Self-pay | Admitting: Family Medicine

## 2018-02-01 NOTE — Telephone Encounter (Signed)
At last visit, pt's wife said they requested a years supply of refills [phone]

## 2018-02-02 NOTE — Telephone Encounter (Signed)
Spoke to wife all Rx was send with 90 days supply with year worth refill. Explain this to her and I will call pharmacy after session.

## 2018-04-11 ENCOUNTER — Telehealth: Payer: Self-pay | Admitting: Family Medicine

## 2018-04-11 NOTE — Telephone Encounter (Signed)
Called to schedule Medicare Annual Wellness Visit with the Nurse Health Advisor. No answer on Home and Mobile phone.  Left message on both for patient to call Janace Hoard at [phone].  If patient returns call, please note: their last AWV was on 02/06/2014, please schedule AWV with [facility] any date 02/07/2015  Thank you! For any questions please contact: Janace Hoard at [phone] or Skype [email or web address]

## 2018-04-12 NOTE — Telephone Encounter (Signed)
Called to schedule Medicare Annual Wellness Visit with the Nurse Health Advisor.  DECLINED AWV FOR 2020.   Patient doesn't want to do it this year because he is going through treatment for Lung cancer.   Thank you! For any questions please contact: Janace Hoard at [phone] or Skype [email or web address]

## 2018-08-01 ENCOUNTER — Telehealth: Payer: Self-pay | Admitting: Family Medicine

## 2018-08-01 DIAGNOSIS — M1990 Unspecified osteoarthritis, unspecified site: Secondary | ICD-10-CM

## 2018-08-01 MED ORDER — CELECOXIB 200 MG PO CAPS: 200.0000 mg | ORAL_CAPSULE | Freq: Every day | ORAL | 2 refills | Status: DC

## 2018-08-01 NOTE — Telephone Encounter (Signed)
Patient's celebrex no longer covered by insurance due to hospice admission, managed by [facility].  PA submitted and it was denied due to patient on hospice.  We can consider sending appeal in future.  For now, recommend Goodrx cash pay option, celecoxib 200mg  daily capsule, #30, to [facility], goodrx cost is $25  Will notify us when need more.  Nobie Putnam, [facility] 08/01/2018, 11:55 AM

## 2018-08-02 ENCOUNTER — Telehealth: Payer: Self-pay | Admitting: Family Medicine

## 2018-08-02 NOTE — Telephone Encounter (Signed)
Pt wife called requesting that you call her

## 2018-08-03 NOTE — Telephone Encounter (Signed)
She had question about Celebrex approval which is denied, also done peer to peer still denied and [employer] suggest that they can't guarantee to approve even if we can do appeal since it is for not agreeing with their decision. Patient's wife wanted him to release from hospice to get it approve and advised her that it was oncology who has refereed him to hospice so she needed to contact them. Oncology Nurse Estill Bamberg called regarding patient informed her same thing she agrees and she will call her. Advised her that we had printed coupon printed for good Rx which can reduce cost around $25 for month supply at [place] as compare to $70 at [place] as per Dr. Raliegh Ip. Informed her that it is printed up front for her to pick it up.

## 2018-10-18 ENCOUNTER — Ambulatory Visit (INDEPENDENT_AMBULATORY_CARE_PROVIDER_SITE_OTHER): Payer: Medicare Other | Admitting: Family Medicine

## 2018-10-18 ENCOUNTER — Other Ambulatory Visit: Payer: Self-pay

## 2018-10-18 ENCOUNTER — Encounter: Payer: Self-pay | Admitting: Family Medicine

## 2018-10-18 DIAGNOSIS — K219 Gastro-esophageal reflux disease without esophagitis: Secondary | ICD-10-CM | POA: Diagnosis not present

## 2018-10-18 DIAGNOSIS — M1990 Unspecified osteoarthritis, unspecified site: Secondary | ICD-10-CM

## 2018-10-18 DIAGNOSIS — Z515 Encounter for palliative care: Secondary | ICD-10-CM

## 2018-10-18 DIAGNOSIS — N401 Enlarged prostate with lower urinary tract symptoms: Secondary | ICD-10-CM

## 2018-10-18 DIAGNOSIS — J302 Other seasonal allergic rhinitis: Secondary | ICD-10-CM

## 2018-10-18 DIAGNOSIS — C3491 Malignant neoplasm of unspecified part of right bronchus or lung: Secondary | ICD-10-CM

## 2018-10-18 DIAGNOSIS — R3912 Poor urinary stream: Secondary | ICD-10-CM

## 2018-10-18 MED ORDER — CELECOXIB 200 MG PO CAPS: 200.0000 mg | ORAL_CAPSULE | Freq: Every day | ORAL | 3 refills | Status: AC

## 2018-10-18 MED ORDER — FLUTICASONE PROPIONATE 50 MCG/ACT NA SUSP: 2.0000 | Freq: Every day | NASAL | 3 refills | Status: AC

## 2018-10-18 MED ORDER — OMEPRAZOLE 20 MG PO CPDR: 20.0000 mg | DELAYED_RELEASE_CAPSULE | Freq: Every day | ORAL | 3 refills | Status: AC

## 2018-10-18 MED ORDER — TAMSULOSIN HCL 0.4 MG PO CAPS: 0.8000 mg | ORAL_CAPSULE | Freq: Every day | ORAL | 3 refills | Status: DC

## 2018-10-18 NOTE — Patient Instructions (Signed)
AVS info given by phone. No mychart

## 2018-10-18 NOTE — Assessment & Plan Note (Signed)
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 - AUA BPH score 3 (on med previous score) - Last PSA unknown reported normal - No known personal/family history of prostate CA  Plan: 1. Refilled Tamsulosin 0.8mg  daily (x2 pills)

## 2018-10-18 NOTE — Assessment & Plan Note (Signed)
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 Refill Celebrex 200mg  daily - reviewed risks, side effects, complications 2. Continue PPI while on NSAID 3. Continue regular Tylenol dosing

## 2018-10-18 NOTE — Progress Notes (Signed)
Virtual Visit via Telephone The purpose of this virtual visit is to provide medical care while limiting exposure to the novel coronavirus (COVID19) for both patient and office staff.  Consent was obtained for phone visit:  Yes.   Answered questions that patient had about telehealth interaction:  Yes.   I discussed the limitations, risks, security and privacy concerns of performing an evaluation and management service by telephone. I also discussed with the patient that there may be a patient responsible charge related to this service. The patient expressed understanding and agreed to proceed.  Patient Location: Home Provider Location: [facility])  ---------------------------------------------------------------------- Chief Complaint  Patient presents with  . Gastroesophageal Reflux    needs medication refill    S: Reviewed CMA documentation. 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 He has refused further intervention treatment and is on hospice care. Has remaiend on hospice for past 3-4 months, currently doing well.  Osteoarthritis Multiple joints (Hips ,Shoulders, hands) / History of GERD on NSAID - Reports chronic history of OA/DJD, old injuries and chronic wear and tear without any recent significant change, states no problems below hips, lumbar spine and lower feet/ankles. - He is currently doing well on Celebrex 200mg  daily. Not taking other NSAIDs. Has been on celebrex for years without history of PUD/ GIB, or other problem. He does take Omeprazole 20mg  daily with NSAID nightly to help protect stomach has not had too many problems with GERD in general. - Not taking Tylenol regularly. Limited other therapy - he is requesting refills on Celebrex and Omeprazole today  BPH  history of LUTS, controlled - Reports no new concerns. Has history of enlarged prostate. Improved urinary function on Flomax., tolerating well - Prior PSA, DRE normal - Needs refill Flomax - Denies dizziness, decreased urine output, difficulty voiding, significant nocturia, see BPH score  Allergic Rhinitis Request refill on Fluticasone  Patient is currently in home isolation with weakened immune system Denies any high risk travel to areas of current concern for COVID19. Denies any known or suspected exposure to person with or possibly with COVID19.  Denies any fevers, chills, sweats, body ache, cough, shortness of breath, sinus pain or pressure, headache, abdominal pain, diarrhea  Past Medical History:  Diagnosis Date  . Allergy   . Benign prostatic hypertrophy (BPH) with nocturia   . GERD (gastroesophageal reflux disease)   . Hematuria    Social History   Tobacco Use  . Smoking status: Former Smoker    Packs/day: 1.00    Years: 31.00    Pack years: 31.00    Types: Cigarettes    Quit date: 11/08/2017    Years since quitting: 0.9  . Smokeless tobacco: Former Network engineer Use Topics  . Alcohol use: No    Alcohol/week: 0.0 standard drinks  . Drug use: No    Current Outpatient Medications:  .  celecoxib (CELEBREX) 200 MG capsule, Take 1 capsule (200 mg total) by mouth daily., Disp: 90 capsule, Rfl: 3 .  Cholecalciferol (VITAMIN D-3 PO), Take by mouth., Disp: , Rfl:  .  fluticasone (FLONASE) 50 MCG/ACT nasal spray, Place 2 sprays into both nostrils daily., Disp: 48 g, Rfl: 3 .  guaiFENesin (HERBAL EXPEC PO), Take by mouth., Disp: , Rfl:  .  lidocaine (XYLOCAINE) 2 % solution, [  The details of the medication are not available because there are pending changes by a home health clinician.], Disp: , Rfl:  .  loratadine (CLARITIN) 10 MG tablet, Take 1 tablet (10 mg total) by mouth daily. Use for 4-6 weeks then stop, and use as needed or seasonally, Disp: 90 tablet, Rfl: 3 .   omeprazole (PRILOSEC) 20 MG capsule, Take 1 capsule (20 mg total) by mouth daily. With Celebrex, Disp: 90 capsule, Rfl: 3 .  tamsulosin (FLOMAX) 0.4 MG CAPS capsule, Take 2 capsules (0.8 mg total) by mouth daily., Disp: 180 capsule, Rfl: 3 .  magnesium oxide (MAG-OX) 400 MG tablet, Take 1 tablet (400 mg total) by mouth daily. (Patient not taking: Reported on 10/18/2018), Disp: 90 tablet, Rfl: 3 .  predniSONE (DELTASONE) 10 MG tablet, Take 1 tablet (10 mg total) by mouth daily with breakfast., Disp:  , Rfl:   Depression screen [facility] 2/9 10/18/2018 11/19/2017 11/17/2016  Decreased Interest 0 0 0  Down, Depressed, Hopeless 0 0 0  PHQ - 2 Score 0 0 0    No flowsheet data found.  -------------------------------------------------------------------------- O: No physical exam performed due to remote telephone encounter.  Lab results reviewed.  No results found for this or any previous visit (from the past 2160 hour(s)).  -------------------------------------------------------------------------- A&P:  Problem List Items Addressed This Visit    Arthritis - Primary    Stable chronic OA/DJD multiple joints with chronic pain and flares episodic On hospice care No recent trauma or injury No significant intervention such as surgery/arthroscopy Last imaging not focused on OA no recent  Plan: 1. Refill Celebrex 200mg  daily - reviewed risks, side effects, complications 2. Continue PPI while on NSAID 3. Continue regular Tylenol dosing      Relevant Medications   predniSONE (DELTASONE) 10 MG tablet   celecoxib (CELEBREX) 200 MG capsule   BPH (benign prostatic hyperplasia)    Stable chronic BPH w/ lower urinary tract symptoms (LUTS) and w/o evidence of obstruction. - AUA BPH score 3 (on med previous score) - Last PSA unknown reported normal - No known personal/family history of prostate CA  Plan: 1. Refilled Tamsulosin 0.8mg  daily (x2 pills)      Relevant Medications   tamsulosin (FLOMAX) 0.4  MG CAPS capsule   Gastroesophageal reflux disease    Stable, controlled on PPI Secondary to NSAID use chronic, uses PPI more protectively Refilled Omeprazole 20mg  daily      Relevant Medications   omeprazole (PRILOSEC) 20 MG capsule   Stage IV squamous cell carcinoma of lung, right ([place])    On hospice care currently He has declined further cancer treatment per [facility] back in 4-06/2018      Relevant Medications   predniSONE (DELTASONE) 10 MG tablet    Other Visit Diagnoses    Seasonal allergies       Relevant Medications   fluticasone (FLONASE) 50 MCG/ACT nasal spray   Hospice care patient          Meds ordered this encounter  Medications  . omeprazole (PRILOSEC) 20 MG capsule    Sig: Take 1 capsule (20 mg total) by mouth daily. With Celebrex    Dispense:  90 capsule    Refill:  3    Please bill patient's medical insurance, they request that you do not submit to hospice.  . tamsulosin (FLOMAX) 0.4 MG CAPS capsule    Sig: Take 2 capsules (0.8 mg total) by mouth daily.    Dispense:  180 capsule  Refill:  3    Please bill patient's medical insurance, they request that you do not submit to hospice.  . fluticasone (FLONASE) 50 MCG/ACT nasal spray    Sig: Place 2 sprays into both nostrils daily.    Dispense:  48 g    Refill:  3    Please bill patient's medical insurance, they request that you do not submit to hospice.  . celecoxib (CELEBREX) 200 MG capsule    Sig: Take 1 capsule (200 mg total) by mouth daily.    Dispense:  90 capsule    Refill:  3    Please bill patient's medical insurance, they request that you do not submit to hospice.    Follow-up: - Return in 3-6 months as needed  Patient verbalizes understanding with the above medical recommendations including the limitation of remote medical advice.  Specific follow-up and call-back criteria were given for patient to follow-up or seek medical care more urgently if needed.   - Time spent in direct  consultation with patient on phone: 9 minutes   Nobie Putnam, [facility] 10/18/2018, 2:42 PM

## 2018-10-18 NOTE — Assessment & Plan Note (Signed)
Stable, controlled on PPI Secondary to NSAID use chronic, uses PPI more protectively Refilled Omeprazole 20mg  daily

## 2018-10-18 NOTE — Assessment & Plan Note (Signed)
On hospice care currently He has declined further cancer treatment per [facility] back in 4-06/2018

## 2018-11-19 ENCOUNTER — Other Ambulatory Visit: Payer: Self-pay | Admitting: Family Medicine

## 2018-11-19 DIAGNOSIS — N401 Enlarged prostate with lower urinary tract symptoms: Secondary | ICD-10-CM

## 2018-12-11 ENCOUNTER — Encounter: Payer: Self-pay | Admitting: Family Medicine

## 2018-12-11 DIAGNOSIS — I700 Atherosclerosis of aorta: Secondary | ICD-10-CM | POA: Insufficient documentation

## 2019-01-02 ENCOUNTER — Encounter: Payer: Self-pay | Admitting: Family Medicine

## 2019-01-02 ENCOUNTER — Ambulatory Visit: Payer: Medicare Other | Admitting: Family Medicine

## 2019-01-02 ENCOUNTER — Other Ambulatory Visit: Payer: Self-pay

## 2019-01-02 VITALS — BP 153/89 | HR 98 | Temp 97.6°F | Resp 24 | Ht 68.0 in | Wt 175.0 lb

## 2019-01-02 DIAGNOSIS — H6122 Impacted cerumen, left ear: Secondary | ICD-10-CM | POA: Diagnosis not present

## 2019-01-02 DIAGNOSIS — I700 Atherosclerosis of aorta: Secondary | ICD-10-CM

## 2019-01-02 DIAGNOSIS — C3491 Malignant neoplasm of unspecified part of right bronchus or lung: Secondary | ICD-10-CM | POA: Diagnosis not present

## 2019-01-02 NOTE — Progress Notes (Signed)
Subjective:    Patient ID: Paul Lozano, male    DOB: [birth year], 73 y.o.   MRN: [id number]  Paul Lozano is a 73 y.o. male presenting on 01/02/2019 for Cerumen Impaction (bilateral )   HPI   Cerumen Impaction, L>R Reports L ear clogged with wax reduced hearing and discomfort, request flushing today. He has had before. Has hair within ears making it difficult, not using Q-tips anymore tried those in past. 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 He has refused further intervention treatment and is on hospice care. Has remaiend on hospice and currently doing well He is on chronic prednisone for breathing / hospice and has gained some weight and improved his breathing.  Abdominal Aortic Atherosclerosis Identified on CT imaging, last 02/2018 with moderate disease    Depression screen [facility] 2/9 01/02/2019 10/18/2018 11/19/2017  Decreased Interest 0 0 0  Down, Depressed, Hopeless 0 0 0  PHQ - 2 Score 0 0 0  Altered sleeping 0 - -  Tired, decreased energy 0 - -  Change in appetite 0 - -  Feeling bad or failure about yourself  0 - -  Trouble concentrating 0 - -  Moving slowly or fidgety/restless 0 - -  Suicidal thoughts 0 - -  PHQ-9 Score 0 - -    Social History   Tobacco Use  . Smoking status: Former Smoker    Packs/day: 1.00    Years: 31.00    Pack years: 31.00    Types: Cigarettes    Quit date: 11/08/2017    Years since quitting: 1.1  . Smokeless tobacco: Former Network engineer Use Topics  . Alcohol use: No    Alcohol/week: 0.0 standard drinks  . Drug use: No    Review of Systems Per HPI unless specifically indicated above     Objective:    BP (!) 153/89 (BP Location: Left Arm, Patient Position: Sitting, Cuff Size: Normal)   Pulse 98   Temp 97.6 F (36.4 C) (Oral)   Resp (!) 24   Ht 5\' 8"  (1.727 m)   Wt 175 lb (79.4 kg)   SpO2  97%   BMI 26.61 kg/m   Wt Readings from Last 3 Encounters:  01/02/19 175 lb (79.4 kg)  11/19/17 147 lb 12.8 oz (67 kg)  04/27/17 160 lb (72.6 kg)    Physical Exam Vitals signs and nursing note reviewed.  Constitutional:      General: He is not in acute distress.    Appearance: He is well-developed. He is not diaphoretic.     Comments: Well-appearing, comfortable, cooperative  HENT:     Head: Normocephalic and atraumatic.     Right Ear: Tympanic membrane, ear canal and external ear normal.     Ears:     Comments: Bilateral hair within ear canals  L TM obscured by impacted cerumen Eyes:     General:        Right eye: No discharge.        Left eye: No discharge.     Conjunctiva/sclera: Conjunctivae normal.  Cardiovascular:     Rate and Rhythm: Normal rate and regular rhythm.     Pulses: Normal pulses.     Heart sounds: Normal heart sounds. No murmur.  Pulmonary:     Effort: Pulmonary effort is normal.     Breath sounds: Wheezing present.  Skin:    General: Skin is warm and dry.     Findings: No erythema or rash.  Neurological:     Mental Status: He is alert and oriented to person, place, and time.  Psychiatric:        Behavior: Behavior normal.     Comments: Well groomed, good eye contact, normal speech and thoughts     ________________________________________________________ PROCEDURE NOTE Date: 01/02/19 Bilateral Ear Lavage / Cerumen Removal Discussed benefits and risks (including pain / discomforts, dizziness, minor abrasion of ear canal). Verbal consent given by patient. Medication:  carbamide peroxide ear drops, Ear Lavage Solution (warm water + hydrogen peroxide) Performed by Dr Parks Ranger, [place]nie [place], CMA Several drops of carbamide peroxide placed in each ear, allowed to sit for few minutes. Ear lavage solution flushed into one ear at a time in attempt to dislodge and remove ear wax. Results were eventually successful on Left with removal of large cerumen  impaction.  Repeat Ear Exam: - Completely removed cerumen now, with clear ear canals and visible TMs clear and normal appearance.     I have personally reviewed the radiology report from 02/28/18  CT Abd Pelvis.  CT Abdomen Pelvis W Contrast1/13/2020 [facility] Result Impression   - No evidence of metastatic disease to the abdomen/pelvis.   - Chronic/incidental findings, as above.  Result Narrative  EXAM: CT ABDOMEN PELVIS W CONTRAST DATE: 02/28/2018 3:40 PM ACCESSION: [id number] UN DICTATED: 02/28/2018 3:47 PM INTERPRETATION LOCATION: [place]  CLINICAL INDICATION: restaging lung cancer - C34.91 - Squamous cell carcinoma of right lung (CMS - HCC)   COMPARISON: Chest CT 01/18/2018, and earlier including a PET CT from 11/10/2017  TECHNIQUE: A spiral CT scan of the abdomen and pelvis was obtained with IV contrast from the lung bases through the pubic symphysis. Images were reconstructed in the axial plane. Coronal and sagittal reformatted images were also provided for further evaluation.  FINDINGS:   LOWER THORAX: See same day CT of the chest for findings above the level of the diaphragm.  HEPATOBILIARY: 1.5 cm right hepatic dome hypodensity (1:13) which was seen on CT from 2002. No new focal hepatic abnormalities. The gallbladder is present and otherwise unremarkable. No biliary dilatation.  SPLEEN: Unremarkable. PANCREAS: Head/neck atrophy. Otherwise, unremarkable.  ADRENALS: Unremarkable. KIDNEYS/URETERS: Unchanged renal cysts. Nonobstructing punctate left lower pole renal calculus (1:57).  BLADDER: Layering stones versus calcifications within the bladder lumen. PELVIC/REPRODUCTIVE ORGANS: Prostatomegaly measuring up to 5.7 cm in transverse axial dimension with coarse prostatic calcifications.  GI TRACT: No dilated or thick walled loops of bowel. Colonic diverticula.  PERITONEUM/RETROPERITONEUM AND MESENTERY: No free air or fluid. LYMPH NODES: No enlarged lymph  nodes.  VESSELS: The aorta is normal in caliber. Moderate atherosclerotic disease of the abdominal aorta and branch vessels. The portal venous system is patent. The hepatic veins and IVC are unremarkable.  BONES AND SOFT TISSUES: Right lower quadrant subcutaneous surgical clips. Small fat-containing umbilical hernia. Stable L2 sclerotic lesion at least since 2014.  Other Result Information  Interface, Rad Results In - 02/28/2018 10:48 PM EST EXAM: CT ABDOMEN PELVIS W CONTRAST DATE: 02/28/2018 3:40 PM ACCESSION: [id number] UN DICTATED: 02/28/2018 3:47 PM INTERPRETATION LOCATION: [place]  CLINICAL INDICATION: restaging lung cancer  - C34.91 - Squamous cell carcinoma of right lung (CMS - HCC)    COMPARISON: Chest CT 01/18/2018, and earlier including a PET CT from 11/10/2017  TECHNIQUE: A spiral CT scan of the abdomen and pelvis was obtained with IV contrast from the lung bases  through the pubic symphysis. Images were reconstructed in the axial plane. Coronal and sagittal reformatted images were also provided for further evaluation.  FINDINGS:   LOWER THORAX: See same day CT of the chest for findings above the level of the diaphragm.  HEPATOBILIARY: 1.5 cm right hepatic dome hypodensity (1:13) which was seen on CT from 2002. No new focal hepatic abnormalities. The gallbladder is present and otherwise unremarkable. No biliary dilatation.   SPLEEN: Unremarkable. PANCREAS: Head/neck atrophy. Otherwise, unremarkable.  ADRENALS: Unremarkable. KIDNEYS/URETERS: Unchanged renal cysts. Nonobstructing punctate left lower pole renal calculus (1:57).  BLADDER: Layering stones versus calcifications within the bladder lumen. PELVIC/REPRODUCTIVE ORGANS: Prostatomegaly measuring up to 5.7 cm in transverse axial dimension with coarse prostatic calcifications.  GI TRACT: No dilated or thick walled loops of bowel. Colonic diverticula.  PERITONEUM/RETROPERITONEUM AND MESENTERY: No free air or fluid.  LYMPH NODES: No enlarged lymph nodes.  VESSELS: The aorta is normal in caliber.  Moderate atherosclerotic disease of the abdominal aorta and branch vessels. The portal venous system is patent. The hepatic veins and IVC are unremarkable.  BONES AND SOFT TISSUES: Right lower quadrant subcutaneous surgical clips. Small fat-containing umbilical hernia. Stable L2 sclerotic lesion at least since 2014.   IMPRESSION:  - No evidence of metastatic disease to the abdomen/pelvis.   - Chronic/incidental findings, as above.     Results for orders placed or performed in visit on 04/27/17  CULTURE, URINE COMPREHENSIVE   Specimen: Urine  Result Value Ref Range   MICRO NUMBER: [id number]    SPECIMEN QUALITY: ADEQUATE    Source NOT GIVEN    STATUS: FINAL    RESULT:      100-1,000 CFU/mL of Mixed non-uropathogenic Gram positive flora. May represent colonizers from external and internal genitalia. No further testing (including susceptibility) will be performed.  POCT Urinalysis Dipstick  Result Value Ref Range   Color, UA dark amber    Clarity, UA clear    Glucose, UA negative    Bilirubin, UA negative    Ketones, UA negative    Spec Grav, UA 1.010 1.010 - 1.025   Blood, UA large    pH, UA 7.5 5.0 - 8.0   Protein, UA 30    Urobilinogen, UA 4.0 (A) 0.2 or 1.0 E.U./dL   Nitrite, UA negative    Leukocytes, UA Small (1+) (A) Negative   Appearance clear    Odor foul       Assessment & Plan:   Problem List Items Addressed This Visit    Stage IV squamous cell carcinoma of lung, right (HCC)   Atherosclerosis of aorta (HCC)    Other Visit Diagnoses    Left ear impacted cerumen    -  Primary      #Ear cerumen Significant amount of large thick impacted cerumen L>R, suspected primary cause of current reduced hearing  Plan: 1. Successful office ear lavage cerumen removal today, re-evaluated with clear ear canals and normal TMs 2. Counseled on avoiding Q-tips and may use Kleenex as wick, use OTC  Debrox as needed 3. Follow-up as needed   --------------------------------------------  #Atherosclerosis, aorta Chronic problem, stable on prior CT imaging, last 02/2018, considered moderate Defer further aggressive medication / intervention at this time given hospice / stage IV cancer  #Stave IV lung cancer / Hospice Remains under hospice care currently On chronic prednisone 10mg  daily, with improved breathing, and some weight gain Currently has gained weight, no evidence of active malnutrition Discussed in future could continue prednisone as advised  by hospice  No orders of the defined types were placed in this encounter.     Follow up plan: Return if symptoms worsen or fail to improve, for ear wax.  Nobie Putnam, DO [facility] 01/02/2019, 4:59 PM

## 2019-01-02 NOTE — Patient Instructions (Addendum)
Thank you for coming to the office today.  You have thick impacted ear wax (cerumen) blocking ear canals and ear drums. This is the most likely cause of reduced hearing and ear pain and discomfort. - We were able to remove almost all of the ear wax with flushing in the office today  Recommend using the same ear drops at home in the future if ear wax builds up again, over the counter Debrox (Carbamide peroxide), use on both sides following instructions on bottle, pharmacist will direct you to the appropriate ear drops if you need help. May take a week or more.  Avoid using Q-tips inside ears, as this can push wax deeper, but you can try to use rolled up kleenex as a wick to absorb fluid and wax as well.  If you are not making progress with ear wax removal at home, or the problem keeps coming back, please notify our office or return for re-evaluation, and we can discuss referral to ENT office for more formal ear wax removal.   Please schedule a Follow-up Appointment to: Return if symptoms worsen or fail to improve, for ear wax.  If you have any other questions or concerns, please feel free to call the office or send a message through [place]. You may also schedule an earlier appointment if necessary.  Additionally, you may be receiving a survey about your experience at our office within a few days to 1 week by e-mail or mail. We value your feedback.  Nobie Putnam, DO [place]

## 2019-03-02 MED ORDER — DEXTROMETHORPHAN-GUAIFENESIN 10-100 MG/5ML PO LIQD
10.00 | ORAL | Status: DC
Start: ? — End: 2019-03-02

## 2019-03-02 MED ORDER — ALBUTEROL SULFATE (2.5 MG/3ML) 0.083% IN NEBU
2.50 | INHALATION_SOLUTION | RESPIRATORY_TRACT | Status: DC
Start: ? — End: 2019-03-02

## 2019-03-02 MED ORDER — MORPHINE SULFATE 2 MG/ML IJ SOLN
2.00 | INTRAMUSCULAR | Status: DC
Start: ? — End: 2019-03-02

## 2019-03-02 MED ORDER — TAMSULOSIN HCL 0.4 MG PO CAPS
0.80 | ORAL_CAPSULE | ORAL | Status: DC
Start: 2019-03-02 — End: 2019-03-02

## 2019-03-02 MED ORDER — POLYETHYLENE GLYCOL 3350 17 GM/SCOOP PO POWD
17.00 | ORAL | Status: DC
Start: 2019-03-02 — End: 2019-03-02

## 2019-03-02 MED ORDER — ACETAMINOPHEN 500 MG PO TABS
1000.00 | ORAL_TABLET | ORAL | Status: DC
Start: ? — End: 2019-03-02

## 2019-03-02 MED ORDER — ONDANSETRON HCL 8 MG PO TABS
8.00 | ORAL_TABLET | ORAL | Status: DC
Start: ? — End: 2019-03-02

## 2019-03-02 MED ORDER — BACLOFEN 10 MG PO TABS
10.00 | ORAL_TABLET | ORAL | Status: DC
Start: ? — End: 2019-03-02

## 2019-03-02 MED ORDER — SENNOSIDES 8.6 MG PO TABS
2.00 | ORAL_TABLET | ORAL | Status: DC
Start: 2019-03-02 — End: 2019-03-02

## 2019-03-02 MED ORDER — LORAZEPAM 2 MG/ML IJ SOLN
1.00 | INTRAMUSCULAR | Status: DC
Start: ? — End: 2019-03-02

## 2019-03-02 MED ORDER — MORPHINE SULFATE (CONCENTRATE) 20 MG/ML PO SOLN
5.00 | ORAL | Status: DC
Start: ? — End: 2019-03-02

## 2019-03-08 ENCOUNTER — Telehealth: Payer: Self-pay | Admitting: Family Medicine

## 2019-03-08 NOTE — Telephone Encounter (Signed)
Pt . Wife called said that pt passed on 07-Mar-2022

## 2019-03-08 NOTE — Telephone Encounter (Signed)
Acknowledged. Updated chart.  Paul Lozano, [facility] 03/08/2019, 3:07 PM

## 2019-03-20 DEATH — deceased
